# Patient Record
Sex: Male | Born: 1935
Health system: Southern US, Community
[De-identification: ages and names within clinical notes are randomized; demographics above are authoritative.]

## PROBLEM LIST (undated history)

## (undated) DIAGNOSIS — G4730 Sleep apnea, unspecified: Secondary | ICD-10-CM

## (undated) DIAGNOSIS — I10 Essential (primary) hypertension: Secondary | ICD-10-CM

## (undated) DIAGNOSIS — D72819 Decreased white blood cell count, unspecified: Principal | ICD-10-CM

## (undated) DIAGNOSIS — K219 Gastro-esophageal reflux disease without esophagitis: Secondary | ICD-10-CM

## (undated) HISTORY — DX: Sleep apnea, unspecified: G47.30

## (undated) HISTORY — DX: Decreased white blood cell count, unspecified: D72.819

## (undated) HISTORY — DX: Gastro-esophageal reflux disease without esophagitis: K21.9

## (undated) HISTORY — DX: Essential (primary) hypertension: I10

## (undated) HISTORY — PX: OTHER SURGICAL HISTORY: SHX169

---

## 2000-08-30 ENCOUNTER — Encounter: Payer: Self-pay | Admitting: Family Medicine

## 2000-08-30 ENCOUNTER — Ambulatory Visit (HOSPITAL_COMMUNITY): Admission: RE | Admit: 2000-08-30 | Discharge: 2000-08-30 | Payer: Self-pay | Admitting: Physician Assistant

## 2003-03-04 ENCOUNTER — Ambulatory Visit (HOSPITAL_COMMUNITY): Admission: RE | Admit: 2003-03-04 | Discharge: 2003-03-04 | Payer: Self-pay | Admitting: Gastroenterology

## 2003-03-04 ENCOUNTER — Encounter (INDEPENDENT_AMBULATORY_CARE_PROVIDER_SITE_OTHER): Payer: Self-pay | Admitting: Specialist

## 2004-12-31 ENCOUNTER — Ambulatory Visit: Payer: Self-pay | Admitting: Internal Medicine

## 2005-01-26 ENCOUNTER — Ambulatory Visit (HOSPITAL_COMMUNITY): Admission: RE | Admit: 2005-01-26 | Discharge: 2005-01-26 | Payer: Self-pay | Admitting: Family Medicine

## 2008-05-10 ENCOUNTER — Ambulatory Visit: Payer: Self-pay | Admitting: Internal Medicine

## 2008-05-10 DIAGNOSIS — G4733 Obstructive sleep apnea (adult) (pediatric): Secondary | ICD-10-CM

## 2008-05-20 DIAGNOSIS — I10 Essential (primary) hypertension: Secondary | ICD-10-CM | POA: Insufficient documentation

## 2008-05-20 DIAGNOSIS — K219 Gastro-esophageal reflux disease without esophagitis: Secondary | ICD-10-CM

## 2008-09-19 ENCOUNTER — Ambulatory Visit: Payer: Self-pay | Admitting: Oncology

## 2008-10-03 LAB — CMP (CANCER CENTER ONLY)
ALT(SGPT): 23 U/L (ref 10–47)
AST: 22 U/L (ref 11–38)
Albumin: 3.7 g/dL (ref 3.3–5.5)
Alkaline Phosphatase: 68 U/L (ref 26–84)
BUN, Bld: 14 mg/dL (ref 7–22)
CO2: 27 mEq/L (ref 18–33)
Calcium: 9.5 mg/dL (ref 8.0–10.3)
Chloride: 105 mEq/L (ref 98–108)
Creat: 1.2 mg/dl (ref 0.6–1.2)
Glucose, Bld: 91 mg/dL (ref 73–118)
Potassium: 3.8 mEq/L (ref 3.3–4.7)
Sodium: 137 mEq/L (ref 128–145)
Total Bilirubin: 0.7 mg/dl (ref 0.20–1.60)
Total Protein: 7 g/dL (ref 6.4–8.1)

## 2008-10-03 LAB — CBC WITH DIFFERENTIAL (CANCER CENTER ONLY)
BASO#: 0 10*3/uL (ref 0.0–0.2)
EOS%: 1 % (ref 0.0–7.0)
Eosinophils Absolute: 0 10*3/uL (ref 0.0–0.5)
HCT: 41.6 % (ref 38.7–49.9)
HGB: 14.4 g/dL (ref 13.0–17.1)
MCH: 28.7 pg (ref 28.0–33.4)
MCHC: 34.5 g/dL (ref 32.0–35.9)
MCV: 83 fL (ref 82–98)
MONO%: 7.8 % (ref 0.0–13.0)
NEUT%: 55.6 % (ref 40.0–80.0)
RBC: 5.01 10*6/uL (ref 4.20–5.70)

## 2008-10-03 LAB — MORPHOLOGY - CHCC SATELLITE: PLT EST ~~LOC~~: ADEQUATE

## 2008-10-07 LAB — FERRITIN: Ferritin: 23 ng/mL (ref 22–322)

## 2008-10-07 LAB — IRON AND TIBC
%SAT: 27 % (ref 20–55)
Iron: 98 ug/dL (ref 42–165)
TIBC: 365 ug/dL (ref 215–435)
UIBC: 267 ug/dL

## 2008-10-07 LAB — RETICULOCYTES (CHCC): Retic Ct Pct: 0.7 % (ref 0.4–3.1)

## 2008-10-07 LAB — PROTEIN ELECTROPHORESIS, SERUM
Albumin ELP: 60.8 % (ref 55.8–66.1)
Alpha-1-Globulin: 3.5 % (ref 2.9–4.9)
Beta 2: 4.5 % (ref 3.2–6.5)
Total Protein, Serum Electrophoresis: 6.9 g/dL (ref 6.0–8.3)

## 2008-10-07 LAB — ERYTHROPOIETIN: Erythropoietin: 18.9 m[IU]/mL (ref 2.6–34.0)

## 2008-12-05 ENCOUNTER — Ambulatory Visit: Payer: Self-pay | Admitting: Oncology

## 2008-12-10 LAB — CBC WITH DIFFERENTIAL (CANCER CENTER ONLY)
BASO#: 0 10*3/uL (ref 0.0–0.2)
Eosinophils Absolute: 0.1 10*3/uL (ref 0.0–0.5)
HCT: 44.1 % (ref 38.7–49.9)
HGB: 15.3 g/dL (ref 13.0–17.1)
LYMPH#: 1.4 10*3/uL (ref 0.9–3.3)
MCHC: 34.6 g/dL (ref 32.0–35.9)
MONO#: 0.3 10*3/uL (ref 0.1–0.9)
NEUT%: 53.6 % (ref 40.0–80.0)
RBC: 4.94 10*6/uL (ref 4.20–5.70)
WBC: 3.8 10*3/uL — ABNORMAL LOW (ref 4.0–10.0)

## 2009-05-09 ENCOUNTER — Ambulatory Visit: Payer: Self-pay | Admitting: Internal Medicine

## 2009-12-04 ENCOUNTER — Ambulatory Visit: Payer: Self-pay | Admitting: Oncology

## 2009-12-11 LAB — CBC WITH DIFFERENTIAL (CANCER CENTER ONLY)
BASO#: 0 10*3/uL (ref 0.0–0.2)
BASO%: 0.7 % (ref 0.0–2.0)
EOS%: 0.9 % (ref 0.0–7.0)
Eosinophils Absolute: 0 10*3/uL (ref 0.0–0.5)
HCT: 43.8 % (ref 38.7–49.9)
HGB: 15.2 g/dL (ref 13.0–17.1)
LYMPH#: 1.3 10*3/uL (ref 0.9–3.3)
LYMPH%: 36.9 % (ref 14.0–48.0)
MCH: 32.7 pg (ref 28.0–33.4)
MCHC: 34.7 g/dL (ref 32.0–35.9)
MCV: 94 fL (ref 82–98)
MONO#: 0.3 10*3/uL (ref 0.1–0.9)
MONO%: 7.3 % (ref 0.0–13.0)
NEUT#: 2 10*3/uL (ref 1.5–6.5)
NEUT%: 54.2 % (ref 40.0–80.0)
Platelets: 181 10*3/uL (ref 145–400)
RBC: 4.65 10*6/uL (ref 4.20–5.70)
RDW: 11.2 % (ref 10.5–14.6)
WBC: 3.6 10*3/uL — ABNORMAL LOW (ref 4.0–10.0)

## 2009-12-11 LAB — FERRITIN: Ferritin: 16 ng/mL — ABNORMAL LOW (ref 22–322)

## 2009-12-11 LAB — IRON AND TIBC
%SAT: 19 % — ABNORMAL LOW (ref 20–55)
Iron: 73 ug/dL (ref 42–165)
TIBC: 381 ug/dL (ref 215–435)
UIBC: 308 ug/dL

## 2009-12-21 ENCOUNTER — Encounter: Payer: Self-pay | Admitting: Internal Medicine

## 2010-05-08 ENCOUNTER — Ambulatory Visit: Admit: 2010-05-08 | Payer: Self-pay | Admitting: Internal Medicine

## 2010-06-04 NOTE — Letter (Signed)
Summary: CPAP Supplies/[facility]  CPAP Supplies/[facility]   Imported By: Sherian Rein 12/29/2009 07:57:01  _____________________________________________________________________  External Attachment:    Type:   Image     Comment:   External Document

## 2010-06-04 NOTE — Assessment & Plan Note (Signed)
Summary: f/u 1 yr///kp   Primary Provider/Referring Provider:  Purnell Shoemaker  CC:  yearly follow up visit-sleep; needs new supplies; no trouble with pressure.Marland Kitchen  History of Present Illness:  05/10/08- OSA  75 YOM Dx'd by me in 1992. Still on cpap  pressure 7 cwp. [facility]. Last here in 2006. Now needs new mask and expresses concern that chronic strap use is putting a dent in his skull.l. Feels pressure is ok.  Bedtime 10-12PM, sleep latency 15 minutes, waking severall times, up at 6:30 AM.  May 09, 2009- OSA Continues CPAP 7 He always feels a little sleepy without change x 50 years. He has to be careful at times about his driving. 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  We discussed sleep habit. 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   Current Medications (verified): 1)  Prilosec Otc 20 Mg Tbec (Omeprazole Magnesium) .... Take 1 By Mouth Once Daily 2)  Hydrochlorothiazide 25 Mg Tabs (Hydrochlorothiazide) .... Take 1/2 By Mouth Once Daily 3)  Cpap  7 .... [facility]  Allergies (verified): 1)  ! Sulfa  Past History:  Past Medical History: Last updated: 05/10/2008 Hypertension Sleep Apnea G E [facility]  Past Surgical History: Last updated: 05/10/2008 Dupyetrens's contracture. Salivary gland resected.  Family History: Last updated: 05/10/2008 No sleep apnea Diabetes Mother died CHF, arthritis Father alive , hx of heart disease  Social History: Last updated: 05/09/2009 Patient never smoked.  Married Designs brick-laying equipment  Risk Factors: Smoking Status: never (05/10/2008)  Social History: Patient never smoked.  Married Government social research officer  Review of Systems      See HPI  The patient denies anorexia, fever, weight loss, weight gain, vision loss, decreased hearing, hoarseness, chest pain, syncope, dyspnea on exertion, peripheral edema, prolonged cough, headaches, hemoptysis, abdominal  pain, and severe indigestion/heartburn.    Vital Signs:  Patient profile:   75 year old male Height:      66 inches Weight:      191.25 pounds BMI:     30.98 O2 Sat:      97 % on Room air Pulse rate:   69 / minute BP sitting:   126 / 86  (right arm) Cuff size:   regular  Vitals Entered By: Reynaldo Minium CMA (May 09, 2009 2:25 PM)  O2 Flow:  Room air  Physical Exam  Additional Exam:  General: A/Ox3; pleasant and cooperative, NAD, glasses SKIN: no rash, lesions NODES: no lymphadenopathy HEENT: /AT, EOM- WNL, Conjuctivae- clear, PERRLA, TM-WNL, Nose- clear, Throat- clear and wnl, Melampatti III, dentures NECK: Supple w/ fair ROM, JVD- none, normal carotid impulses w/o bruits Thyroid- normal to palpation CHEST: Clear to P&A HEART: RRR, no m/g/r heard ABDOMEN: Soft and nl;  GNF:AOZH, nl pulses, no edema  NEURO: Grossly intact to observation      Impression & Recommendations:  Problem # 1:  SLEEP APNEA (ICD-780.57)  We will try increasing his cpap to 8 and letting him try Nuvigil after discussion.  Orders: Est. Patient Level III (08657) DME Referral (DME)  Medications Added to Medication List This Visit: 1)  Cpap 8  .... [facility]  Patient Instructions: 1)  Schedule return in one year, earlier if needed 2)  We will ask [facility] to try increasing your CPAP to 8. If you don't like it let me know and we will turn it back. 3)  Try sample Nuvigil 150 mg, one in the morning  if needed for alertness.

## 2010-06-15 ENCOUNTER — Telehealth: Payer: Self-pay | Admitting: Internal Medicine

## 2010-06-22 ENCOUNTER — Ambulatory Visit (INDEPENDENT_AMBULATORY_CARE_PROVIDER_SITE_OTHER): Payer: Medicare Other | Admitting: Internal Medicine

## 2010-06-22 ENCOUNTER — Encounter: Payer: Self-pay | Admitting: Internal Medicine

## 2010-06-22 ENCOUNTER — Telehealth: Payer: Self-pay | Admitting: Internal Medicine

## 2010-06-22 DIAGNOSIS — G4730 Sleep apnea, unspecified: Secondary | ICD-10-CM

## 2010-06-24 NOTE — Progress Notes (Signed)
Summary: allergies  Phone Note Call from Patient   Caller: Patient Call For: Jas Betten Summary of Call: patient phoned stated that he needs to see Dr Maple Hudson he has a cold and his nose is sore it is a struggle for him to use his CPAP due to his nose. He is having problems with allergies. Dr Sinclair Ship first availabe is 3/15 and he needs to be seen sooner. Please advise pt at [phone] Initial call taken by: Vedia Coffer,  June 15, 2010 11:59 AM  Follow-up for Phone Call        The patient c/o runny nose, nasal congestion and sneezing. Sx's make it difficult to wear cpap at night and he would like to know what to do. He says he is not using any allergy medications or using any nasal sprays. Pt did sch f/u appt w/ [facility] on 06/22/2010. Pls advise.Michel Bickers [facility]  June 15, 2010 3:50 PM ALLERGIES: SULFA  Additional Follow-up for Phone Call Additional follow up Details #1::        per CY---would start with claritin/loratadine or with allergra 180/fexofenadine as needed ...lmomtbc to make pt aware of med recs per CY Randell Loop CMA  June 15, 2010 4:34 PM   spoke with pt and advised of recs.Carron Curie CMA  June 16, 2010 9:16 AM

## 2010-06-25 DIAGNOSIS — J310 Chronic rhinitis: Secondary | ICD-10-CM | POA: Insufficient documentation

## 2010-06-30 NOTE — Progress Notes (Signed)
Summary: requesting replacement cpap  Phone Note From Other Clinic   Caller: Buford Dresser, RT with [facility] Call For: Dr. Maple Hudson Summary of Call: [phone] Mardelle Matte with [facility] called and stated that pt stopped by [facility] to get humidifier for CPAP Device. This is an old CPAP unit and the company doesn't make a seperate humdifier. Pt's old CPAP is currently set on a pressure of 8 cwp, however, it is not holding this pressure. May pt have an order for a replacement CPAP device. Pt's current CPAP machine is older than 73 yrs old.  Please advise. Alfonso Ramus  June 22, 2010 1:57 PM  Initial call taken by: Alfonso Ramus,  June 22, 2010 1:58 PM  Follow-up for Phone Call        Will order new CPAP with humidifier.     New/Updated Medications: * CPAP MACHINE 8 CWP, HEATED HUMIDIFIER AND SUPPLIES.

## 2010-06-30 NOTE — Assessment & Plan Note (Signed)
Summary: 1 yr follow-up/LC   Primary Provider/Referring Provider:  Purnell Shoemaker  CC:  Yearly follow up visit-sleep apnea; nose sore from allergies so had to use Claritin.Seth Gray  History of Present Illness: 05/10/08- OSA  75 YOM Dx'd by me in 1992. Still on cpap  pressure 7 cwp. [facility]. Last here in 2006. Now needs new mask and expresses concern that chronic strap use is putting a dent in his skull.l. Feels pressure is ok.  Bedtime 10-12PM, sleep latency 15 minutes, waking severall times, up at 6:30 AM.  May 09, 2009- OSA Continues CPAP 7 He always feels a little sleepy without change x 50 years. He has to be careful at times about his driving. 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  We discussed sleep habit. 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  June 22, 2010- OSA, rhinitis Nurse-CC: Yearly follow up visit-sleep apnea; nose sore from allergies so had to use Claritin.  CPAP 8/ [facility]-Every morning his nose runs and he sneezes. Uses Nasal pillows mask. Likes that style. Tussive frontal headache. Questioned sinusitis- amoxacillin didn't help and caused rash. Denies fever, epistaxis, wheeze or cough. Does not have a humidifier.     Preventive Screening-Counseling & Management  Alcohol-Tobacco     Smoking Status: never  Current Medications (verified): 1)  Prilosec Otc 20 Mg Tbec (Omeprazole Magnesium) .... Take 1 By Mouth Two Times A Day 2)  Hydrochlorothiazide 25 Mg Tabs (Hydrochlorothiazide) .... Take 1/2 By Mouth Once Daily 3)  Cpap  8 .... [facility]  Allergies: 1)  ! Sulfa 2)  ! Amoxicillin  Past History:  Past Surgical History: Last updated: 05/10/2008 Dupyetrens's contracture. Salivary gland resected.  Family History: Last updated: 05/10/2008 No sleep apnea Diabetes Mother died CHF, arthritis Father alive , hx of heart disease  Social History: Last updated: 05/09/2009 Patient never smoked.    Married Designs brick-laying equipment  Risk Factors: Smoking Status: never (06/22/2010)  Past Medical History: Hypertension Sleep Apnea G E R D Rhinitis  Review of Systems      See HPI       The patient complains of headaches.  The patient denies anorexia, fever, weight loss, weight gain, vision loss, decreased hearing, hoarseness, chest pain, syncope, dyspnea on exertion, peripheral edema, prolonged cough, hemoptysis, abdominal pain, and severe indigestion/heartburn.    Vital Signs:  Patient profile:   75 year old male Height:      66 inches Weight:      187.13 pounds BMI:     30.31 O2 Sat:      96 % on Room air Pulse rate:   61 / minute BP sitting:   122 / 74  (left arm) Cuff size:   regular  Vitals Entered By: Reynaldo Minium CMA (June 22, 2010 12:13 PM)  O2 Flow:  Room air CC: Yearly follow up visit-sleep apnea; nose sore from allergies so had to use Claritin.   Physical Exam  Additional Exam:  General: A/Ox3; pleasant and cooperative, NAD, glasses SKIN: no rash, lesions NODES: no lymphadenopathy HEENT: [place]/AT, EOM- WNL, Conjuctivae- clear, PERRLA, TM-WNL, Nose- clear, Throat- clear and wnl, Mallampati  III, dentures NECK: Supple w/ fair ROM, JVD- none, normal carotid impulses w/o bruits Thyroid- normal to palpation CHEST: Clear to P&A HEART: RRR, no m/g/r heard ABDOMEN: Soft and nl;  ZOX:WRUE, nl pulses, no edema  NEURO: Grossly intact to observation      Impression & Recommendations:  Problem # 1:  SLEEP APNEA (ICD-780.57)  Still good compliance and control with CPAP. He sleeps in unheated room, without CPAP humidifier. We will continue pressure at 8, but see about addinjg humidifier. He might also like nasal saline gel..   Problem # 2:  CHRONIC RHINITIS (ICD-472.0) Describes persistent variable nasal congestion. sometimes CPAP pressure is uncomfortable. Not much sneeze, drainage. There may be some infection, but adequate humidification may be  enough intervention now. .   Medications Added to Medication List This Visit: 1)  Prilosec Otc 20 Mg Tbec (Omeprazole magnesium) .... Take 1 by mouth two times a day 2)  Cpap Humidifier  3)  Cpap Machine 8 Cwp, Heated Humidifier and Supplies.  .... [facility]  Other Orders: Est. Patient Level III (04540) DME Referral (DME)  Patient Instructions: 1)  Please schedule a follow-up appointment in 1 year. 2)  Try nasal saline gel and/or saline nose spray- otc 3)  We will contact [facility] about getting you a humidifier for your machine. Prescriptions: CPAP MACHINE 8 CWP, HEATED HUMIDIFIER AND SUPPLIES.   #1 x prn   Entered and Authorized by:   Waymon Budge MD   Signed by:   Waymon Budge MD on 06/23/2010   Method used:   Print then Give to Patient   RxID:   [id number] CPAP  8 [facility]  #1 x 0   Entered and Authorized by:   Waymon Budge MD   Signed by:   Waymon Budge MD on 06/23/2010   Method used:   Print then Give to Patient   RxID:   [id number]

## 2010-09-18 NOTE — Op Note (Signed)
   NAME:  Seth Gray, Seth Gray                            ACCOUNT NO.:  [id number]   MEDICAL RECORD NO.:  [id number]                   PATIENT TYPE:  AMB   LOCATION:  ENDO                                 FACILITY:  [facility]   PHYSICIAN:  John C. Madilyn Fireman, M.D.                 DATE OF BIRTH:  07/06/1935   DATE OF PROCEDURE:  03/04/2003  DATE OF DISCHARGE:                                 OPERATIVE REPORT   PROCEDURE:  Colonoscopy.   INDICATION FOR PROCEDURE:  Colon cancer screening in a 75 year old patient  with no prior screening.   DESCRIPTION OF PROCEDURE:  The patient was placed in the left lateral  decubitus position and placed on the pulse monitor with continuous low-flow  oxygen delivered by nasal cannula.  He was sedated with 50 mcg IV fentanyl  and 5 mg IV Versed.  The Olympus video colonoscope was inserted into the  rectum and advanced to the cecum, confirmed by transillumination at  McBurney's point and visualization of the ileocecal valve and appendiceal  orifice.  The prep was good.  The cecum and ascending colon appeared normal  with no masses, polyps, diverticula, or other mucosal abnormalities.  In the  transverse colon was an 8 mm sessile polyp, which was fulgurated by hot  biopsy.  The remainder of the transverse colon appeared normal.  The  descending and sigmoid also appeared normal.  The scope was then withdrawn.  The rectum appeared normal with no further polyps and retroflexed view of  the anus revealed no obvious hemorrhoids.  The scope was then withdrawn and  the patient returned to the recovery room in stable condition.  He tolerated  the procedure well, and there were no immediate complications.   IMPRESSION:  Transverse colon polyp, otherwise normal study.   PLAN:  Await histology to determine method and interval for future colon  screening.                                               John C. Madilyn Fireman, M.D.    JCH/MEDQ  D:  03/04/2003  T:  03/04/2003  Job:   [id number]   cc:   Lianne Bushy, M.D.  [address]  [place]  [place] [address]  Fax: [phone]

## 2011-01-29 ENCOUNTER — Other Ambulatory Visit: Payer: Self-pay | Admitting: Oncology

## 2011-01-29 ENCOUNTER — Encounter (HOSPITAL_BASED_OUTPATIENT_CLINIC_OR_DEPARTMENT_OTHER): Payer: Medicare Other | Admitting: Oncology

## 2011-01-29 DIAGNOSIS — D72819 Decreased white blood cell count, unspecified: Secondary | ICD-10-CM

## 2011-01-29 LAB — CBC WITH DIFFERENTIAL/PLATELET
Basophils Absolute: 0 10*3/uL (ref 0.0–0.1)
EOS%: 0.9 % (ref 0.0–7.0)
Eosinophils Absolute: 0 10*3/uL (ref 0.0–0.5)
LYMPH%: 34.4 % (ref 14.0–49.0)
MCH: 33.9 pg — ABNORMAL HIGH (ref 27.2–33.4)
MCV: 96.3 fL (ref 79.3–98.0)
MONO%: 6.5 % (ref 0.0–14.0)
Platelets: 169 10*3/uL (ref 140–400)
RBC: 4.63 10*6/uL (ref 4.20–5.82)
RDW: 13.1 % (ref 11.0–14.6)

## 2011-01-29 LAB — IRON AND TIBC
Iron: 92 ug/dL (ref 42–165)
UIBC: 251 ug/dL (ref 125–400)

## 2011-01-29 LAB — FERRITIN: Ferritin: 30 ng/mL (ref 22–322)

## 2011-05-21 DIAGNOSIS — N400 Enlarged prostate without lower urinary tract symptoms: Secondary | ICD-10-CM | POA: Diagnosis not present

## 2011-05-31 DIAGNOSIS — K219 Gastro-esophageal reflux disease without esophagitis: Secondary | ICD-10-CM | POA: Diagnosis not present

## 2011-05-31 DIAGNOSIS — Z79899 Other long term (current) drug therapy: Secondary | ICD-10-CM | POA: Diagnosis not present

## 2011-05-31 DIAGNOSIS — I10 Essential (primary) hypertension: Secondary | ICD-10-CM | POA: Diagnosis not present

## 2011-05-31 DIAGNOSIS — G4733 Obstructive sleep apnea (adult) (pediatric): Secondary | ICD-10-CM | POA: Diagnosis not present

## 2011-05-31 DIAGNOSIS — E781 Pure hyperglyceridemia: Secondary | ICD-10-CM | POA: Diagnosis not present

## 2011-06-22 ENCOUNTER — Encounter: Payer: Self-pay | Admitting: *Deleted

## 2011-06-23 ENCOUNTER — Ambulatory Visit (INDEPENDENT_AMBULATORY_CARE_PROVIDER_SITE_OTHER): Payer: Medicare Other | Admitting: Internal Medicine

## 2011-06-23 ENCOUNTER — Encounter: Payer: Self-pay | Admitting: Internal Medicine

## 2011-06-23 VITALS — BP 124/76 | HR 64 | Ht 66.0 in | Wt 189.8 lb

## 2011-06-23 DIAGNOSIS — G4733 Obstructive sleep apnea (adult) (pediatric): Secondary | ICD-10-CM

## 2011-06-23 DIAGNOSIS — G4730 Sleep apnea, unspecified: Secondary | ICD-10-CM

## 2011-06-23 DIAGNOSIS — J310 Chronic rhinitis: Secondary | ICD-10-CM | POA: Diagnosis not present

## 2011-06-23 NOTE — Patient Instructions (Signed)
We will clarify with [facility] what documentation is needed to qualify for replacement CPAP machine, mask and supplies   Script for replacement CPAP machine, 8 cwp, heated humidifier, mask of choice and supplies      For Dx OSA

## 2011-06-23 NOTE — Progress Notes (Signed)
06/23/11- 75 yoM never smoker followed for OSA, allergic rhinitis   LOV- 06/22/10 The heat of trying to get a humidifier for his CPAP machine which is 28 or 74 years old. [facility] is working with him. Pressure it 8 CWP. He is comfortable and using it all night every night. Perennial nasal congestion is under good control now. He uses occasional Benadryl and we compared that to nonsedating meds. We discussed the pending spring pollen season.  ROS-see HPI Constitutional:   No-   weight loss, night sweats, fevers, chills, fatigue, lassitude. HEENT:   No-  headaches, difficulty swallowing, tooth/dental problems, sore throat,       Little  sneezing, itching, ear ache, nasal congestion, post nasal drip,  CV:  No-   chest pain, orthopnea, PND, swelling in lower extremities, anasarca, dizziness, palpitations Resp: No-   shortness of breath with exertion or at rest.              No-   productive cough,  No non-productive cough,  No- coughing up of blood.              No-   change in color of mucus.  No- wheezing.   Skin: No-   rash or lesions. GI:  No-   heartburn, indigestion, abdominal pain, nausea, vomiting, diarrhea,                 change in bowel habits, loss of appetite GU: MS:  No-   joint pain or swelling.  No- decreased range of motion.  No- back pain. Neuro-     nothing unusual Psych:  No- change in mood or affect. No depression or anxiety.  No memory loss.  OBJ- Physical Exam General- Alert, Oriented, Affect-appropriate, Distress- none acute, overweight Skin- rash-none, lesions- none, excoriation- none Lymphadenopathy- none Head- atraumatic            Eyes- Gross vision intact, PERRLA, conjunctivae and secretions clear            Ears- Hearing, canals-normal            Nose- Clear, no-Septal dev, mucus, polyps, erosion, perforation             Throat- Mallampati III mucosa clear , drainage- none, tonsils- atrophic Neck- flexible , trachea midline, no stridor , thyroid nl, carotid no  bruit Chest - symmetrical excursion , unlabored           Heart/CV- RRR , no murmur , no gallop  , no rub, nl s1 s2                           - JVD- none , edema- none, stasis changes- none, varices- none           Lung- clear to P&A, wheeze- none, cough- none , dullness-none, rub- none           Chest wall-  Abd- Br/ Gen/ Rectal- Not done, not indicated Extrem- cyanosis- none, clubbing, none, atrophy- none, strength- nl Neuro- grossly intact to observation

## 2011-06-24 ENCOUNTER — Telehealth: Payer: Self-pay | Admitting: Internal Medicine

## 2011-06-24 DIAGNOSIS — G4733 Obstructive sleep apnea (adult) (pediatric): Secondary | ICD-10-CM

## 2011-06-24 NOTE — Telephone Encounter (Signed)
Per CY-okay to place order for home sleep study. Pt's wife aware we have placed order.

## 2011-06-24 NOTE — Telephone Encounter (Signed)
I spoke with spouse and she states since pt last sleep study was done in the 90's he will need to have another sleep study before medicare will pay for a new cpap. She stated pt is requesting to have a home sleep study done. Please advise Dr. Maple Hudson, thanks

## 2011-06-26 NOTE — Assessment & Plan Note (Signed)
We will clarify with [facility] further he is eligible for a new machine and that he would need a new study. Meanwhile we are seeking his previous study from the paper records.

## 2011-06-26 NOTE — Assessment & Plan Note (Signed)
Plan-suggested nonsedating OTC antihistamines. Try a sample Dymista

## 2011-06-30 ENCOUNTER — Ambulatory Visit (INDEPENDENT_AMBULATORY_CARE_PROVIDER_SITE_OTHER): Payer: Medicare Other | Admitting: Internal Medicine

## 2011-06-30 DIAGNOSIS — G4733 Obstructive sleep apnea (adult) (pediatric): Secondary | ICD-10-CM | POA: Diagnosis not present

## 2011-07-08 DIAGNOSIS — L84 Corns and callosities: Secondary | ICD-10-CM | POA: Diagnosis not present

## 2011-07-16 DIAGNOSIS — Z23 Encounter for immunization: Secondary | ICD-10-CM | POA: Diagnosis not present

## 2011-11-18 DIAGNOSIS — E781 Pure hyperglyceridemia: Secondary | ICD-10-CM | POA: Diagnosis not present

## 2011-11-18 DIAGNOSIS — T6391XA Toxic effect of contact with unspecified venomous animal, accidental (unintentional), initial encounter: Secondary | ICD-10-CM | POA: Diagnosis not present

## 2011-11-18 DIAGNOSIS — R609 Edema, unspecified: Secondary | ICD-10-CM | POA: Diagnosis not present

## 2011-11-18 DIAGNOSIS — I10 Essential (primary) hypertension: Secondary | ICD-10-CM | POA: Diagnosis not present

## 2011-11-18 DIAGNOSIS — M6740 Ganglion, unspecified site: Secondary | ICD-10-CM | POA: Diagnosis not present

## 2012-01-01 ENCOUNTER — Telehealth: Payer: Self-pay | Admitting: Oncology

## 2012-01-01 NOTE — Telephone Encounter (Signed)
S/w pt wife re appt for 11/18.

## 2012-02-28 DIAGNOSIS — Z23 Encounter for immunization: Secondary | ICD-10-CM | POA: Diagnosis not present

## 2012-03-17 ENCOUNTER — Other Ambulatory Visit: Payer: Self-pay | Admitting: Emergency Medicine

## 2012-03-17 DIAGNOSIS — D649 Anemia, unspecified: Secondary | ICD-10-CM

## 2012-03-20 ENCOUNTER — Other Ambulatory Visit (HOSPITAL_BASED_OUTPATIENT_CLINIC_OR_DEPARTMENT_OTHER): Payer: Medicare Other | Admitting: Lab

## 2012-03-20 ENCOUNTER — Encounter: Payer: Self-pay | Admitting: Oncology

## 2012-03-20 ENCOUNTER — Telehealth: Payer: Self-pay | Admitting: Oncology

## 2012-03-20 ENCOUNTER — Ambulatory Visit (HOSPITAL_BASED_OUTPATIENT_CLINIC_OR_DEPARTMENT_OTHER): Payer: Medicare Other | Admitting: Oncology

## 2012-03-20 VITALS — BP 137/81 | HR 64 | Temp 97.6°F | Resp 20 | Ht 66.0 in | Wt 185.0 lb

## 2012-03-20 DIAGNOSIS — D72819 Decreased white blood cell count, unspecified: Secondary | ICD-10-CM | POA: Diagnosis not present

## 2012-03-20 DIAGNOSIS — D509 Iron deficiency anemia, unspecified: Secondary | ICD-10-CM | POA: Diagnosis not present

## 2012-03-20 DIAGNOSIS — D649 Anemia, unspecified: Secondary | ICD-10-CM

## 2012-03-20 HISTORY — DX: Decreased white blood cell count, unspecified: D72.819

## 2012-03-20 LAB — CBC WITH DIFFERENTIAL/PLATELET
BASO%: 0.7 % (ref 0.0–2.0)
LYMPH%: 32.3 % (ref 14.0–49.0)
MCHC: 34.4 g/dL (ref 32.0–36.0)
MCV: 93.1 fL (ref 79.3–98.0)
MONO%: 7.9 % (ref 0.0–14.0)
Platelets: 177 10*3/uL (ref 140–400)
RBC: 4.51 10*6/uL (ref 4.20–5.82)

## 2012-03-20 LAB — IRON AND TIBC: %SAT: 24 % (ref 20–55)

## 2012-03-20 LAB — FERRITIN: Ferritin: 14 ng/mL — ABNORMAL LOW (ref 22–322)

## 2012-03-20 NOTE — Patient Instructions (Addendum)
Doing well  Lab Results  Component Value Date   WBC 4.6 03/20/2012   HGB 14.4 03/20/2012   HCT 41.9 03/20/2012   MCV 93.1 03/20/2012   PLT 177 03/20/2012

## 2012-03-20 NOTE — Telephone Encounter (Signed)
gve the pt his nov 2014 appt calendar °

## 2012-03-20 NOTE — Progress Notes (Signed)
  OFFICE PROGRESS NOTE  CC  No primary provider on file. No primary provider on file.  DIAGNOSIS: 76 year old gentleman with mild leukopenia and hypoferritinemia  PRIOR THERAPY:  #1 leukopenia: Observation  #2hypoferritinemia: Oral iron as needed  CURRENT THERAPY: Observation  INTERVAL HISTORY: Seth Gray 76 y.o. male returns for followup visit today. Overall he is doing well no real complaints. He has not had any infections no hospitalizations. He denies any nausea vomiting abdominal pain. No myalgias and arthralgias no rashes. Remainder of the 10 point review of systems is negative.  MEDICAL HISTORY: Past Medical History  Diagnosis Date  . Hypertension   . Sleep apnea   . ALLERGIC RHINITIS   . GERD (gastroesophageal reflux disease)   . Leukopenia 03/20/2012    ALLERGIES:  is allergic to amoxicillin and sulfonamide derivatives.  MEDICATIONS:  Current Outpatient Prescriptions  Medication Sig Dispense Refill  . aspirin 81 MG tablet Take 81 mg by mouth daily.      . hydrochlorothiazide (HYDRODIURIL) 25 MG tablet Take 1/2 tablet by mouth daily      . omeprazole (PRILOSEC) 20 MG capsule Take 20 mg by mouth 2 (two) times daily.        SURGICAL HISTORY:  Past Surgical History  Procedure Date  . Dupyetren's contracture   . Salivary gland extraction     REVIEW OF SYSTEMS:  Pertinent items are noted in HPI.   HEALTH MAINTENANCE:  PHYSICAL EXAMINATION: Blood pressure 137/81, pulse 64, temperature 97.6 F (36.4 C), temperature source Oral, resp. rate 20, height 5\' 6"  (1.676 m), weight 185 lb (83.915 kg). Body mass index is 29.86 kg/(m^2). ECOG PERFORMANCE STATUS: 0 - Asymptomatic Well-developed well-nourished male in no acute distress HEENT exam EOMI PERRLA sclerae anicteric no conjunctival pallor oral mucosa is moist no thrush no mucositis neck is supple lungs are clear cardiovascular regular rate rhythm abdomen is soft extremities no edema neuro is nonfocal     LABORATORY DATA: Lab Results  Component Value Date   WBC 4.6 03/20/2012   HGB 14.4 03/20/2012   HCT 41.9 03/20/2012   MCV 93.1 03/20/2012   PLT 177 03/20/2012      Chemistry      Component Value Date/Time   NA 137 10/03/2008 1251   K 3.8 10/03/2008 1251   CL 105 10/03/2008 1251   CO2 27 10/03/2008 1251   BUN 14 10/03/2008 1251   CREATININE 1.2 10/03/2008 1251      Component Value Date/Time   CALCIUM 9.5 10/03/2008 1251   ALKPHOS 68 10/03/2008 1251   AST 22 10/03/2008 1251   BILITOT 0.70 10/03/2008 1251       RADIOGRAPHIC STUDIES:  No results found.  ASSESSMENT: 76 year old gentleman with leukopenia and low ferritin. He is on observation for leukopenia. For his low ferritin he does take oral iron as needed. His CBC looks terrific today we will check up on his iron studies.   PLAN: He will continue to followup with me on a yearly basis   All questions were answered. The patient knows to call the clinic with any problems, questions or concerns. We can certainly see the patient much sooner if necessary.  I spent 15 minutes counseling the patient face to face. The total time spent in the appointment was 30 minutes.    Drue Second, MD Medical/Oncology [facility] [phone] (beeper) [phone] (Office)  03/20/2012, 1:10 PM

## 2012-03-21 ENCOUNTER — Telehealth: Payer: Self-pay | Admitting: *Deleted

## 2012-03-21 NOTE — Telephone Encounter (Signed)
Message copied by Cooper Render on Tue Mar 21, 2012  1:44 PM ------      Message from: Laural Golden      Created: Tue Mar 21, 2012  9:22 AM       Please tell patient to continue oral iron.              ----- Message -----         From: [facility] Interface         Sent: 03/20/2012  12:06 PM           To: Victorino December, MD

## 2012-03-21 NOTE — Telephone Encounter (Signed)
Left VMOVM for patient to continue taking oral iron as instructed by MD.

## 2012-04-18 DIAGNOSIS — L578 Other skin changes due to chronic exposure to nonionizing radiation: Secondary | ICD-10-CM | POA: Diagnosis not present

## 2012-04-18 DIAGNOSIS — L821 Other seborrheic keratosis: Secondary | ICD-10-CM | POA: Diagnosis not present

## 2012-04-18 DIAGNOSIS — L570 Actinic keratosis: Secondary | ICD-10-CM | POA: Diagnosis not present

## 2012-04-18 DIAGNOSIS — D1739 Benign lipomatous neoplasm of skin and subcutaneous tissue of other sites: Secondary | ICD-10-CM | POA: Diagnosis not present

## 2012-05-11 DIAGNOSIS — H524 Presbyopia: Secondary | ICD-10-CM | POA: Diagnosis not present

## 2012-05-11 DIAGNOSIS — H5200 Hypermetropia, unspecified eye: Secondary | ICD-10-CM | POA: Diagnosis not present

## 2012-05-11 DIAGNOSIS — H2510 Age-related nuclear cataract, unspecified eye: Secondary | ICD-10-CM | POA: Diagnosis not present

## 2012-05-11 DIAGNOSIS — H52229 Regular astigmatism, unspecified eye: Secondary | ICD-10-CM | POA: Diagnosis not present

## 2012-05-24 DIAGNOSIS — I10 Essential (primary) hypertension: Secondary | ICD-10-CM | POA: Diagnosis not present

## 2012-05-24 DIAGNOSIS — E781 Pure hyperglyceridemia: Secondary | ICD-10-CM | POA: Diagnosis not present

## 2012-05-24 DIAGNOSIS — Z0000 Encounter for general adult medical examination without abnormal findings: Secondary | ICD-10-CM | POA: Diagnosis not present

## 2012-05-24 DIAGNOSIS — Z125 Encounter for screening for malignant neoplasm of prostate: Secondary | ICD-10-CM | POA: Diagnosis not present

## 2012-06-22 ENCOUNTER — Ambulatory Visit: Payer: Medicare Other | Admitting: Internal Medicine

## 2012-07-18 DIAGNOSIS — H40019 Open angle with borderline findings, low risk, unspecified eye: Secondary | ICD-10-CM | POA: Diagnosis not present

## 2012-07-18 DIAGNOSIS — H2510 Age-related nuclear cataract, unspecified eye: Secondary | ICD-10-CM | POA: Diagnosis not present

## 2012-07-25 ENCOUNTER — Ambulatory Visit (INDEPENDENT_AMBULATORY_CARE_PROVIDER_SITE_OTHER): Payer: Medicare Other | Admitting: Internal Medicine

## 2012-07-25 ENCOUNTER — Encounter: Payer: Self-pay | Admitting: Internal Medicine

## 2012-07-25 VITALS — BP 126/78 | HR 58 | Ht 66.0 in | Wt 189.2 lb

## 2012-07-25 DIAGNOSIS — G4733 Obstructive sleep apnea (adult) (pediatric): Secondary | ICD-10-CM | POA: Diagnosis not present

## 2012-07-25 NOTE — Progress Notes (Signed)
06/23/11- 75 yoM never smoker followed for OSA, allergic rhinitis   LOV- 06/22/10 The heat of trying to get a humidifier for his CPAP machine which is 14 or 75 years old. [facility] is working with him. Pressure it 8 CWP. He is comfortable and using it all night every night. Perennial nasal congestion is under good control now. He uses occasional Benadryl and we compared that to nonsedating meds. We discussed the pending spring pollen season.  07/25/12- 76 yoM never smoker followed for OSA, allergic rhinitis FOLLOWS FOR: wears CPAP every night for about 6-8 hours and pressure working well Has new CPAP machine 8/[facility], but still using his old machine because he says it works fine. He thinks he is sleeping well. He considers his mild seasonal allergic rhinitis to be only a nuisance. We discussed antihistamines if needed.  ROS-see HPI Constitutional:   No-   weight loss, night sweats, fevers, chills, fatigue, lassitude. HEENT:   No-  headaches, difficulty swallowing, tooth/dental problems, sore throat,       +Little  sneezing, itching, ear ache, nasal congestion, post nasal drip,  CV:  No-   chest pain, orthopnea, PND, swelling in lower extremities, anasarca, dizziness, palpitations Resp: No-   shortness of breath with exertion or at rest.              No-   productive cough,  No non-productive cough,  No- coughing up of blood.              No-   change in color of mucus.  No- wheezing.   Skin: No-   rash or lesions. GI:  No-   heartburn, indigestion, abdominal pain, nausea, vomiting,  GU: MS:  No-   joint pain or swelling.  . Neuro-     nothing unusual Psych:  No- change in mood or affect. No depression or anxiety.  No memory loss.  OBJ- Physical Exam General- Alert, Oriented, Affect-appropriate, Distress- none acute, overweight Skin- rash-none, lesions- none, excoriation- none Lymphadenopathy- none Head- atraumatic            Eyes- Gross vision intact, PERRLA, conjunctivae and secretions  clear            Ears- Hearing aid            Nose- Clear, no-Septal dev, mucus, polyps, erosion, perforation             Throat- Mallampati III mucosa clear , drainage- none, tonsils- atrophic. + Upper plate, lower partial. Neck- flexible , trachea midline, no stridor , thyroid nl, carotid no bruit Chest - symmetrical excursion , unlabored           Heart/CV- RRR , no murmur , no gallop  , no rub, nl s1 s2                           - JVD- none , edema- none, stasis changes- none, varices- none           Lung- clear to P&A, wheeze- none, cough- none , dullness-none, rub- none           Chest wall-  Abd- Br/ Gen/ Rectal- Not done, not indicated Extrem- cyanosis- none, clubbing, none, atrophy- none, strength- nl Neuro- grossly intact to observation

## 2012-07-25 NOTE — Patient Instructions (Addendum)
We can continue CPAP 8/ [facility]  Order- DME [facility]- replacement CPAP mask of choice and supplies  Dx OSA  Consider looking, maybe at [facility] or [employer], for a rechargeable battery pack that would run your CPAP machine overnight if power goes off. [facility] might also have some suggestion for this.   Please call as needed

## 2012-07-27 DIAGNOSIS — M6740 Ganglion, unspecified site: Secondary | ICD-10-CM | POA: Diagnosis not present

## 2012-07-27 DIAGNOSIS — Z1331 Encounter for screening for depression: Secondary | ICD-10-CM | POA: Diagnosis not present

## 2012-07-30 NOTE — Assessment & Plan Note (Signed)
Good CPAP compliance and control. The pressure is appropriate. Plan-replacement mask and supplies. Discussed  sleep hygiene.

## 2012-09-18 DIAGNOSIS — H2510 Age-related nuclear cataract, unspecified eye: Secondary | ICD-10-CM | POA: Diagnosis not present

## 2012-09-18 DIAGNOSIS — H269 Unspecified cataract: Secondary | ICD-10-CM | POA: Diagnosis not present

## 2012-09-18 DIAGNOSIS — H52209 Unspecified astigmatism, unspecified eye: Secondary | ICD-10-CM | POA: Diagnosis not present

## 2012-09-19 DIAGNOSIS — H2510 Age-related nuclear cataract, unspecified eye: Secondary | ICD-10-CM | POA: Diagnosis not present

## 2012-10-02 DIAGNOSIS — H2510 Age-related nuclear cataract, unspecified eye: Secondary | ICD-10-CM | POA: Diagnosis not present

## 2012-10-02 DIAGNOSIS — H269 Unspecified cataract: Secondary | ICD-10-CM | POA: Diagnosis not present

## 2012-10-02 DIAGNOSIS — H52209 Unspecified astigmatism, unspecified eye: Secondary | ICD-10-CM | POA: Diagnosis not present

## 2012-10-04 DIAGNOSIS — S8010XA Contusion of unspecified lower leg, initial encounter: Secondary | ICD-10-CM | POA: Diagnosis not present

## 2012-10-09 DIAGNOSIS — H6120 Impacted cerumen, unspecified ear: Secondary | ICD-10-CM | POA: Diagnosis not present

## 2012-10-09 DIAGNOSIS — R229 Localized swelling, mass and lump, unspecified: Secondary | ICD-10-CM | POA: Diagnosis not present

## 2012-10-09 DIAGNOSIS — T148 Other injury of unspecified body region: Secondary | ICD-10-CM | POA: Diagnosis not present

## 2012-10-09 DIAGNOSIS — W57XXXA Bitten or stung by nonvenomous insect and other nonvenomous arthropods, initial encounter: Secondary | ICD-10-CM | POA: Diagnosis not present

## 2012-11-01 DIAGNOSIS — J019 Acute sinusitis, unspecified: Secondary | ICD-10-CM | POA: Diagnosis not present

## 2012-11-23 ENCOUNTER — Telehealth: Payer: Self-pay | Admitting: Oncology

## 2012-11-23 DIAGNOSIS — J309 Allergic rhinitis, unspecified: Secondary | ICD-10-CM | POA: Diagnosis not present

## 2012-11-23 DIAGNOSIS — D509 Iron deficiency anemia, unspecified: Secondary | ICD-10-CM | POA: Diagnosis not present

## 2012-11-23 DIAGNOSIS — I10 Essential (primary) hypertension: Secondary | ICD-10-CM | POA: Diagnosis not present

## 2012-11-23 NOTE — Telephone Encounter (Signed)
Fax medical records to [facility].

## 2012-12-12 DIAGNOSIS — L821 Other seborrheic keratosis: Secondary | ICD-10-CM | POA: Diagnosis not present

## 2012-12-12 DIAGNOSIS — D1739 Benign lipomatous neoplasm of skin and subcutaneous tissue of other sites: Secondary | ICD-10-CM | POA: Diagnosis not present

## 2012-12-12 DIAGNOSIS — L820 Inflamed seborrheic keratosis: Secondary | ICD-10-CM | POA: Diagnosis not present

## 2012-12-29 ENCOUNTER — Telehealth: Payer: Self-pay | Admitting: *Deleted

## 2012-12-29 NOTE — Telephone Encounter (Signed)
sw pt informed the pt on 03/21/13 kk will be in [facility]. gv appts for 03/26/13 w/ labs@ 9:30am and ov@ 10am. Pt is aware.Seth Kitchentd

## 2013-01-10 DIAGNOSIS — K219 Gastro-esophageal reflux disease without esophagitis: Secondary | ICD-10-CM | POA: Diagnosis not present

## 2013-01-10 DIAGNOSIS — R0789 Other chest pain: Secondary | ICD-10-CM | POA: Diagnosis not present

## 2013-01-15 ENCOUNTER — Ambulatory Visit (INDEPENDENT_AMBULATORY_CARE_PROVIDER_SITE_OTHER): Payer: Medicare Other | Admitting: Cardiovascular Disease

## 2013-01-15 ENCOUNTER — Encounter: Payer: Self-pay | Admitting: Cardiovascular Disease

## 2013-01-15 VITALS — BP 124/74 | HR 59 | Resp 16 | Ht 66.5 in | Wt 190.7 lb

## 2013-01-15 DIAGNOSIS — I10 Essential (primary) hypertension: Secondary | ICD-10-CM | POA: Diagnosis not present

## 2013-01-15 DIAGNOSIS — G4733 Obstructive sleep apnea (adult) (pediatric): Secondary | ICD-10-CM | POA: Diagnosis not present

## 2013-01-15 DIAGNOSIS — R079 Chest pain, unspecified: Secondary | ICD-10-CM

## 2013-01-15 DIAGNOSIS — K219 Gastro-esophageal reflux disease without esophagitis: Secondary | ICD-10-CM | POA: Diagnosis not present

## 2013-01-15 MED ORDER — OMEPRAZOLE 20 MG PO CPDR: 20.0000 mg | DELAYED_RELEASE_CAPSULE | Freq: Two times a day (BID) | ORAL | Status: DC

## 2013-01-15 NOTE — Assessment & Plan Note (Signed)
His symptoms have none of the typical hallmarks of coronary insufficiency. The symptoms occur at rest and are not in any way worsened by fairly intense physical activity. His symptoms are relieved by antacids and belching. Omeprazole diminishes the frequency of events. He has had similar complaints for years. A previous stress test was "false positive" with normal coronary angiography findings.

## 2013-01-15 NOTE — Assessment & Plan Note (Signed)
CPAP compliant.  

## 2013-01-15 NOTE — Progress Notes (Signed)
Patient ID: Seth Gray, male   DOB: 1936/01/18, 77 y.o.   MRN: [id number]     Reason for office visit Chest pain evaluation  Mr. Novacek is a fit and active older man with a recent episode of protracted chest discomfort. He has HTN and several paternal uncles with early CAD, but no other coronary risk factors. Although the symptoms began during light activity, they do not appear clearly related to exertion.Similar episodes have occurred for more than 10-15 years and are sporadic and associate GI symptoms. Coronary angio in 2001 was normal, after a false positive ECG stress test.Now asymptomatic. Is troubled frequently by GERD.    Allergies  Allergen Reactions  . Amoxicillin     REACTION: rash  . Sulfonamide Derivatives     Current Outpatient Prescriptions  Medication Sig Dispense Refill  . aspirin 81 MG tablet Take 81 mg by mouth daily.      . diphenhydrAMINE (BENADRYL) 25 MG tablet Take 25 mg by mouth as needed for itching.      . ferrous fumarate (HEMOCYTE - 106 MG FE) 325 (106 FE) MG TABS Take 1 tablet by mouth daily.      . hydrochlorothiazide (HYDRODIURIL) 25 MG tablet Take 1/2 tablet by mouth daily      . NITROSTAT 0.4 MG SL tablet Place 0.4 mg under the tongue every 5 (five) minutes as needed.       Marland Kitchen omeprazole (PRILOSEC) 20 MG capsule Take 1 capsule (20 mg total) by mouth 2 (two) times daily.  60 capsule  6  . fluticasone (FLONASE) 50 MCG/ACT nasal spray as needed.      . loratadine (CLARITIN) 10 MG tablet Take 10 mg by mouth daily.       No current facility-administered medications for this visit.    Past Medical History  Diagnosis Date  . Hypertension   . Sleep apnea   . ALLERGIC RHINITIS   . GERD (gastroesophageal reflux disease)   . Leukopenia 03/20/2012    Past Surgical History  Procedure Laterality Date  . Dupyetren's contracture    . Salivary gland extraction      Family History  Problem Relation Age of Onset  . Arthritis Mother   . Heart failure  Mother   . Heart disease Father     History   Social History  . Marital Status: Married    Spouse Name: N/A    Number of Children: N/A  . Years of Education: N/A   Occupational History  . Not on file.   Social History Main Topics  . Smoking status: Never Smoker   . Smokeless tobacco: Not on file  . Alcohol Use: No  . Drug Use: No  . Sexual Activity: Not Currently   Other Topics Concern  . Not on file   Social History Narrative  . No narrative on file    Review of systems: The patient specifically denies any chest pain at rest or with exertion, dyspnea at rest or with exertion, orthopnea, paroxysmal nocturnal dyspnea, syncope, palpitations, focal neurological deficits, intermittent claudication, lower extremity edema, unexplained weight gain, cough, hemoptysis or wheezing.  The patient has heartburn and fluid reflux symptoms, but denies abdominal pain, nausea, vomiting, dysphagia, diarrhea, constipation, polyuria, polydipsia, dysuria, hematuria, frequency, urgency, abnormal bleeding or bruising, fever, chills, unexpected weight changes, mood swings, change in skin or hair texture, change in voice quality, auditory or visual problems, allergic reactions or rashes, new musculoskeletal complaints other than usual "aches and pains".  PHYSICAL EXAM BP 124/74  Pulse 59  Resp 16  Ht 5' 6.5" (1.689 m)  Wt 190 lb 11.2 oz (86.501 kg)  BMI 30.32 kg/m2  General: Alert, oriented x3, no distress Head: no evidence of trauma, PERRL, EOMI, no exophtalmos or lid lag, no myxedema, no xanthelasma; normal ears, nose and oropharynx Neck: normal jugular venous pulsations and no hepatojugular reflux; brisk carotid pulses without delay and no carotid bruits Chest: clear to auscultation, no signs of consolidation by percussion or palpation, normal fremitus, symmetrical and full respiratory excursions Cardiovascular: normal position and quality of the apical impulse, regular rhythm, normal first  and second heart sounds, no murmurs, rubs or gallops Abdomen: no tenderness or distention, no masses by palpation, no abnormal pulsatility or arterial bruits, normal bowel sounds, no hepatosplenomegaly Extremities: no clubbing, cyanosis or edema; 2+ radial, ulnar and brachial pulses bilaterally; 2+ right femoral, posterior tibial and dorsalis pedis pulses; 2+ left femoral, posterior tibial and dorsalis pedis pulses; no subclavian or femoral bruits. Scar of Dupuytren's release L hand Neurological: grossly nonfocal    EKG: .sinus brady, normal  Lipid Panel  No results found for this basename: chol, trig, hdl, cholhdl, vldl, ldlcalc    BMET    Component Value Date/Time   NA 137 10/03/2008 1251   K 3.8 10/03/2008 1251   CL 105 10/03/2008 1251   CO2 27 10/03/2008 1251   GLUCOSE 91 10/03/2008 1251   BUN 14 10/03/2008 1251   CREATININE 1.2 10/03/2008 1251   CALCIUM 9.5 10/03/2008 1251     ASSESSMENT AND PLAN Chest pain at rest His symptoms have none of the typical hallmarks of coronary insufficiency. The symptoms occur at rest and are not in any way worsened by fairly intense physical activity. His symptoms are relieved by antacids and belching. Omeprazole diminishes the frequency of events. He has had similar complaints for years. A previous stress test was "false positive" with normal coronary angiography findings.  G E R D His chest discomfort is most likely related to GERD. Previously, had improvement with twice daily omeprazole (and not much better with Nexium).  HYPERTENSION Well controlled  Obstructive sleep apnea CPAP compliant   Will increase his PPI to twice daily. If symptoms do not resolve or worsen, schedule for treadmill nuclear perfusion study (Myoview.)   Orders Placed This Encounter  Procedures  . EKG 12-Lead   Meds ordered this encounter  Medications  . fluticasone (FLONASE) 50 MCG/ACT nasal spray    Sig: as needed.  Marland Kitchen NITROSTAT 0.4 MG SL tablet    Sig: Place 0.4 mg  under the tongue every 5 (five) minutes as needed.   Marland Kitchen omeprazole (PRILOSEC) 20 MG capsule    Sig: Take 1 capsule (20 mg total) by mouth 2 (two) times daily.    Dispense:  60 capsule    Refill:  6    Azlynn Mitnick  Thurmon Fair, MD, [facility] [phone] office [phone] pager

## 2013-01-15 NOTE — Assessment & Plan Note (Signed)
Well controlled 

## 2013-01-15 NOTE — Patient Instructions (Addendum)
Increase the Omeprazole to twice a day.  A new prescription has been sent to your pharmacy.  Your physician recommends that you schedule a follow-up appointment in: 6 months.

## 2013-01-15 NOTE — Assessment & Plan Note (Signed)
His chest discomfort is most likely related to GERD. Previously, had improvement with twice daily omeprazole (and not much better with Nexium).

## 2013-02-06 ENCOUNTER — Encounter: Payer: Self-pay | Admitting: Cardiovascular Disease

## 2013-02-11 DIAGNOSIS — Z23 Encounter for immunization: Secondary | ICD-10-CM | POA: Diagnosis not present

## 2013-03-21 ENCOUNTER — Ambulatory Visit: Payer: Medicare Other | Admitting: Oncology

## 2013-03-21 ENCOUNTER — Other Ambulatory Visit: Payer: Medicare Other | Admitting: Lab

## 2013-03-26 ENCOUNTER — Encounter (INDEPENDENT_AMBULATORY_CARE_PROVIDER_SITE_OTHER): Payer: Self-pay

## 2013-03-26 ENCOUNTER — Telehealth: Payer: Self-pay | Admitting: Oncology

## 2013-03-26 ENCOUNTER — Encounter: Payer: Self-pay | Admitting: Oncology

## 2013-03-26 ENCOUNTER — Ambulatory Visit (HOSPITAL_BASED_OUTPATIENT_CLINIC_OR_DEPARTMENT_OTHER): Payer: Medicare Other | Admitting: Oncology

## 2013-03-26 ENCOUNTER — Other Ambulatory Visit (HOSPITAL_BASED_OUTPATIENT_CLINIC_OR_DEPARTMENT_OTHER): Payer: Medicare Other | Admitting: Lab

## 2013-03-26 VITALS — BP 119/74 | HR 70 | Temp 98.2°F | Resp 18 | Ht 66.0 in | Wt 186.5 lb

## 2013-03-26 DIAGNOSIS — D649 Anemia, unspecified: Secondary | ICD-10-CM

## 2013-03-26 DIAGNOSIS — D509 Iron deficiency anemia, unspecified: Secondary | ICD-10-CM

## 2013-03-26 DIAGNOSIS — D72819 Decreased white blood cell count, unspecified: Secondary | ICD-10-CM | POA: Diagnosis not present

## 2013-03-26 LAB — CBC WITH DIFFERENTIAL/PLATELET
BASO%: 0.7 % (ref 0.0–2.0)
Eosinophils Absolute: 0 10*3/uL (ref 0.0–0.5)
HCT: 45.4 % (ref 38.4–49.9)
LYMPH%: 28.3 % (ref 14.0–49.0)
MCHC: 34 g/dL (ref 32.0–36.0)
MONO#: 0.3 10*3/uL (ref 0.1–0.9)
NEUT%: 62.4 % (ref 39.0–75.0)
Platelets: 166 10*3/uL (ref 140–400)
WBC: 4.2 10*3/uL (ref 4.0–10.3)

## 2013-03-26 LAB — IRON AND TIBC CHCC
%SAT: 37 % (ref 20–55)
TIBC: 289 ug/dL (ref 202–409)
UIBC: 181 ug/dL (ref 117–376)

## 2013-03-26 NOTE — Telephone Encounter (Signed)
, °

## 2013-03-26 NOTE — Progress Notes (Signed)
OFFICE PROGRESS NOTE  CC  Seth Gray [facility] [address] [place] [address]  DIAGNOSIS: 77 year old gentleman with mild leukopenia and hypoferritinemia  PRIOR THERAPY:  #1 leukopenia: Observation  #2hypoferritinemia: Oral iron as needed  CURRENT THERAPY: Observation  INTERVAL HISTORY: Seth Gray 77 y.o. male returns for followup visit today. Overall he is doing well no real complaints. He has not had any infections no hospitalizations. He denies any nausea vomiting abdominal pain. No myalgias and arthralgias no rashes. Remainder of the 10 point review of systems is negative.  MEDICAL HISTORY: Past Medical History  Diagnosis Date  . Hypertension   . Sleep apnea   . ALLERGIC RHINITIS   . GERD (gastroesophageal reflux disease)   . Leukopenia 03/20/2012    ALLERGIES:  is allergic to amoxicillin and sulfonamide derivatives.  MEDICATIONS:  Current Outpatient Prescriptions  Medication Sig Dispense Refill  . aspirin 81 MG tablet Take 81 mg by mouth daily.      . ferrous sulfate 325 (65 FE) MG tablet Take 325 mg by mouth daily with breakfast.      . fluticasone (FLONASE) 50 MCG/ACT nasal spray as needed.      . hydrochlorothiazide (HYDRODIURIL) 25 MG tablet Take 1/2 tablet by mouth daily      . omeprazole (PRILOSEC) 20 MG capsule Take 1 capsule (20 mg total) by mouth 2 (two) times daily.  60 capsule  6  . diphenhydrAMINE (BENADRYL) 25 MG tablet Take 25 mg by mouth as needed for itching.      . loratadine (CLARITIN) 10 MG tablet Take 10 mg by mouth daily.      Marland Kitchen NITROSTAT 0.4 MG SL tablet Place 0.4 mg under the tongue every 5 (five) minutes as needed.        No current facility-administered medications for this visit.    SURGICAL HISTORY:  Past Surgical History  Procedure Laterality Date  . Dupyetren's contracture    . Salivary gland extraction      REVIEW OF SYSTEMS:  Pertinent items are noted in HPI.   HEALTH MAINTENANCE:  PHYSICAL  EXAMINATION: Blood pressure 119/74, pulse 70, temperature 98.2 F (36.8 C), temperature source Oral, resp. rate 18, height 5\' 6"  (1.676 m), weight 186 lb 8 oz (84.596 kg). Body mass index is 30.12 kg/(m^2). ECOG PERFORMANCE STATUS: 0 - Asymptomatic Well-developed well-nourished male in no acute distress HEENT exam EOMI PERRLA sclerae anicteric no conjunctival pallor oral mucosa is moist no thrush no mucositis neck is supple lungs are clear cardiovascular regular rate rhythm abdomen is soft extremities no edema neuro is nonfocal   LABORATORY DATA: Lab Results  Component Value Date   WBC 4.2 03/26/2013   HGB 15.5 03/26/2013   HCT 45.4 03/26/2013   MCV 96.2 03/26/2013   PLT 166 03/26/2013      Chemistry      Component Value Date/Time   NA 137 10/03/2008 1251   K 3.8 10/03/2008 1251   CL 105 10/03/2008 1251   CO2 27 10/03/2008 1251   BUN 14 10/03/2008 1251   CREATININE 1.2 10/03/2008 1251      Component Value Date/Time   CALCIUM 9.5 10/03/2008 1251   ALKPHOS 68 10/03/2008 1251   AST 22 10/03/2008 1251   ALT 23 10/03/2008 1251   BILITOT 0.70 10/03/2008 1251       RADIOGRAPHIC STUDIES:  No results found.  ASSESSMENT: 77 year old gentleman with leukopenia and low ferritin. He is on observation for leukopenia. For his low ferritin he  does take oral iron as needed. His CBC looks terrific today we will check up on his iron studies.   PLAN: He will continue to followup with me on a yearly basis   All questions were answered. The patient knows to call the clinic with any problems, questions or concerns. We can certainly see the patient much sooner if necessary.  I spent 15 minutes counseling the patient face to face. The total time spent in the appointment was 30 minutes.    Drue Second, MD Medical/[facility] [phone] (beeper) [phone] (Office)  03/26/2013, 10:34 AM

## 2013-03-28 ENCOUNTER — Telehealth: Payer: Self-pay | Admitting: *Deleted

## 2013-03-28 NOTE — Telephone Encounter (Signed)
Message copied by Cooper Render on Wed Mar 28, 2013  8:36 AM ------      Message from: Illa Level      Created: Tue Mar 27, 2013 12:52 PM       Call patient and inform that iron studies are good.              Thanks,       L       ----- Message -----         From: [facility] Interface         Sent: 03/26/2013   9:31 AM           To: Victorino December, MD                   ------

## 2013-03-28 NOTE — Telephone Encounter (Signed)
Per NP notified pt iron studies are good. LMOVM. Requested pt call back to confirm message rcvd.

## 2013-04-20 DIAGNOSIS — L570 Actinic keratosis: Secondary | ICD-10-CM | POA: Diagnosis not present

## 2013-05-14 DIAGNOSIS — H52229 Regular astigmatism, unspecified eye: Secondary | ICD-10-CM | POA: Diagnosis not present

## 2013-05-14 DIAGNOSIS — H5210 Myopia, unspecified eye: Secondary | ICD-10-CM | POA: Diagnosis not present

## 2013-05-14 DIAGNOSIS — Z961 Presence of intraocular lens: Secondary | ICD-10-CM | POA: Diagnosis not present

## 2013-05-14 DIAGNOSIS — H04129 Dry eye syndrome of unspecified lacrimal gland: Secondary | ICD-10-CM | POA: Diagnosis not present

## 2013-07-26 ENCOUNTER — Ambulatory Visit: Payer: Medicare Other | Admitting: Internal Medicine

## 2013-08-02 DIAGNOSIS — R42 Dizziness and giddiness: Secondary | ICD-10-CM | POA: Diagnosis not present

## 2013-08-07 ENCOUNTER — Encounter: Payer: Self-pay | Admitting: *Deleted

## 2013-08-10 ENCOUNTER — Telehealth: Payer: Self-pay | Admitting: Cardiovascular Disease

## 2013-08-13 ENCOUNTER — Ambulatory Visit: Payer: Medicare Other | Admitting: Cardiovascular Disease

## 2013-08-16 ENCOUNTER — Encounter: Payer: Self-pay | Admitting: Internal Medicine

## 2013-08-16 ENCOUNTER — Ambulatory Visit (INDEPENDENT_AMBULATORY_CARE_PROVIDER_SITE_OTHER): Payer: Medicare Other | Admitting: Internal Medicine

## 2013-08-16 ENCOUNTER — Encounter (INDEPENDENT_AMBULATORY_CARE_PROVIDER_SITE_OTHER): Payer: Self-pay

## 2013-08-16 VITALS — BP 118/78 | HR 59 | Ht 66.0 in | Wt 190.8 lb

## 2013-08-16 DIAGNOSIS — G4733 Obstructive sleep apnea (adult) (pediatric): Secondary | ICD-10-CM | POA: Diagnosis not present

## 2013-08-16 NOTE — Patient Instructions (Signed)
Order- [facility]- Replacement CPAP mask of choice and supplies as needed    Dx OSA  Please call as needed

## 2013-08-16 NOTE — Progress Notes (Signed)
06/23/11- 75 yoM never smoker followed for OSA, allergic rhinitis   LOV- 06/22/10 The heat of trying to get a humidifier for his CPAP machine which is 69 or 76 years old. [facility] is working with him. Pressure it 8 CWP. He is comfortable and using it all night every night. Perennial nasal congestion is under good control now. He uses occasional Benadryl and we compared that to nonsedating meds. We discussed the pending spring pollen season.  07/25/12- 76 yoM never smoker followed for OSA, allergic rhinitis FOLLOWS FOR: wears CPAP every night for about 6-8 hours and pressure working well Has new CPAP machine 8/[facility], but still using his old machine because he says it works fine. He thinks he is sleeping well. He considers his mild seasonal allergic rhinitis to be only a nuisance. We discussed antihistamines if needed.  08/16/13- 63 yoM never smoker followed for OSA, allergic rhinitis complicated by GERD, Leukopenia CPAP 8/[facility]       PCP [place]: Pt stated he is sleeping alright. Wearing CPAP 8/ [facility] nightly for 7-8 hours. Denies issue with machine and pressure. Pt states the nasal pillows are begining to wear- needs replacement.   ROS-see HPI Constitutional:   No-   weight loss, night sweats, fevers, chills, fatigue, lassitude. HEENT:   No-  headaches, difficulty swallowing, tooth/dental problems, sore throat,       +Little  sneezing, itching, ear ache, nasal congestion, post nasal drip,  CV:  No-   chest pain, orthopnea, PND, swelling in lower extremities, anasarca, dizziness, palpitations Resp: No-   shortness of breath with exertion or at rest.              No-   productive cough,  No non-productive cough,  No- coughing up of blood.              No-   change in color of mucus.  No- wheezing.   Skin: No-   rash or lesions. GI:  No-   heartburn, indigestion, abdominal pain, nausea, vomiting,  GU: MS:  No-   joint pain or swelling.  . Neuro-     +occasional  dizziness Psych:  No- change in mood or affect. No depression or anxiety.  No memory loss.  OBJ- Physical Exam General- Alert, Oriented, Affect-appropriate, Distress- none acute, overweight Skin- rash-none, lesions- none, excoriation- none Lymphadenopathy- none Head- atraumatic            Eyes- Gross vision intact, PERRLA, conjunctivae and secretions clear            Ears- Hearing aid            Nose- Clear, no-Septal dev, mucus, polyps, erosion, perforation             Throat- Mallampati III mucosa clear , drainage- none, tonsils- atrophic. + Upper plate, lower partial. Neck- flexible , trachea midline, no stridor , thyroid nl, carotid no bruit Chest - symmetrical excursion , unlabored           Heart/CV- RRR , no murmur , no gallop  , no rub, nl s1 s2                           - JVD- none , edema- none, stasis changes- none, varices- none           Lung- clear to P&A, wheeze- none, cough- none , dullness-none, rub- none  Chest wall-  Abd- Br/ Gen/ Rectal- Not done, not indicated Extrem- cyanosis- none, clubbing, none, atrophy- none, strength- nl Neuro- grossly intact to observation. No carotid bruit, no nystagmus

## 2013-08-21 DIAGNOSIS — Z0000 Encounter for general adult medical examination without abnormal findings: Secondary | ICD-10-CM | POA: Diagnosis not present

## 2013-08-21 DIAGNOSIS — I10 Essential (primary) hypertension: Secondary | ICD-10-CM | POA: Diagnosis not present

## 2013-08-21 DIAGNOSIS — R413 Other amnesia: Secondary | ICD-10-CM | POA: Diagnosis not present

## 2013-08-21 DIAGNOSIS — E781 Pure hyperglyceridemia: Secondary | ICD-10-CM | POA: Diagnosis not present

## 2013-08-21 DIAGNOSIS — Z79899 Other long term (current) drug therapy: Secondary | ICD-10-CM | POA: Diagnosis not present

## 2013-08-21 DIAGNOSIS — Z125 Encounter for screening for malignant neoplasm of prostate: Secondary | ICD-10-CM | POA: Diagnosis not present

## 2013-08-21 DIAGNOSIS — K219 Gastro-esophageal reflux disease without esophagitis: Secondary | ICD-10-CM | POA: Diagnosis not present

## 2013-08-23 NOTE — Telephone Encounter (Signed)
Closed encounter °

## 2013-09-06 DIAGNOSIS — M25569 Pain in unspecified knee: Secondary | ICD-10-CM | POA: Diagnosis not present

## 2013-09-16 NOTE — Assessment & Plan Note (Signed)
Controlled obstructive apnea- good compliance and control Plan-[facility] replace CPAP mask and supplies

## 2013-09-21 ENCOUNTER — Encounter: Payer: Self-pay | Admitting: Cardiovascular Disease

## 2013-09-21 ENCOUNTER — Ambulatory Visit (INDEPENDENT_AMBULATORY_CARE_PROVIDER_SITE_OTHER): Payer: Medicare Other | Admitting: Cardiovascular Disease

## 2013-09-21 VITALS — BP 116/72 | HR 53 | Ht 67.0 in | Wt 191.0 lb

## 2013-09-21 DIAGNOSIS — I10 Essential (primary) hypertension: Secondary | ICD-10-CM

## 2013-09-21 NOTE — Progress Notes (Signed)
Patient ID: Seth Gray, male   DOB: 06-06-1935, 78 y.o.   MRN: [id number]   Reason for office visit Chest pain evaluation  Mr. Hjort is an active 78 year old M who presents for a routine follow up of chest pain. This was previously evaluated in September 2014 and thought to be very atypical and low risk for cardiac etiology. More likely, the discomfort was GI related. He had normal coronary angiography in 2001. He denies any recent chest pain. He does not have shortness of breath and is only limited in any of his activities by left knee pain. He has not required any nitroglycerin. The patient states that his lipids have been checked through his PCP, but we do not have records of that today.     Allergies  Allergen Reactions  . Amoxicillin     REACTION: rash  . Sulfonamide Derivatives     Current Outpatient Prescriptions  Medication Sig Dispense Refill  . aspirin 81 MG tablet Take 81 mg by mouth daily.      . ferrous sulfate 325 (65 FE) MG tablet Take 325 mg by mouth daily with breakfast.      . hydrochlorothiazide (HYDRODIURIL) 25 MG tablet Take 1/2 tablet by mouth daily      . meclizine (ANTIVERT) 25 MG tablet Take 1 tablet by mouth as needed.      . meloxicam (MOBIC) 15 MG tablet Take 15 mg by mouth daily as needed.      Marland Kitchen NITROSTAT 0.4 MG SL tablet Place 0.4 mg under the tongue every 5 (five) minutes as needed.       Marland Kitchen omeprazole (PRILOSEC) 20 MG capsule Take 20 mg by mouth daily.       No current facility-administered medications for this visit.    Past Medical History  Diagnosis Date  . Hypertension   . Sleep apnea   . ALLERGIC RHINITIS   . GERD (gastroesophageal reflux disease)   . Leukopenia 03/20/2012    Past Surgical History  Procedure Laterality Date  . Dupyetren's contracture    . Salivary gland extraction      Family History  Problem Relation Age of Onset  . Arthritis Mother   . Heart failure Mother   . Heart disease Father     History   Social  History  . Marital Status: Married    Spouse Name: N/A    Number of Children: N/A  . Years of Education: N/A   Occupational History  . Not on file.   Social History Main Topics  . Smoking status: Never Smoker   . Smokeless tobacco: Not on file  . Alcohol Use: No  . Drug Use: No  . Sexual Activity: Not Currently   Other Topics Concern  . Not on file   Social History Narrative  . No narrative on file    Review of systems: The patient specifically denies any chest pain at rest or with exertion, dyspnea at rest or with exertion, orthopnea, paroxysmal nocturnal dyspnea, syncope, palpitations, focal neurological deficits, intermittent claudication, lower extremity edema, unexplained weight gain, cough, hemoptysis or wheezing.  The patient has heartburn and fluid reflux symptoms, but denies abdominal pain, nausea, vomiting, dysphagia, diarrhea, constipation, polyuria, polydipsia, dysuria, hematuria, frequency, urgency, abnormal bleeding or bruising, fever, chills, unexpected weight changes, mood swings, change in skin or hair texture, change in voice quality, auditory or visual problems, allergic reactions or rashes, new musculoskeletal complaints other than usual "aches and pains".   PHYSICAL EXAM  BP 116/72  Pulse 53  Ht 5\' 7"  (1.702 m)  Wt 191 lb (86.637 kg)  BMI 29.91 kg/m2  General: Alert, oriented x3, no distress Head: no evidence of trauma, PERRL, EOMI, no exophtalmos or lid lag, no myxedema, no xanthelasma; normal ears, nose and oropharynx Neck: normal jugular venous pulsations and no hepatojugular reflux; brisk carotid pulses without delay and no carotid bruits Chest: clear to auscultation, no signs of consolidation by percussion or palpation, normal fremitus, symmetrical and full respiratory excursions Cardiovascular: normal position and quality of the apical impulse, regular rhythm, normal first and second heart sounds, no murmurs, rubs or gallops Abdomen: no tenderness or  distention, no masses by palpation, no abnormal pulsatility or arterial bruits, normal bowel sounds, no hepatosplenomegaly Extremities: no clubbing, cyanosis or edema; 2+ radial, ulnar and brachial pulses bilaterally; 2+ right femoral, posterior tibial and dorsalis pedis pulses; 2+ left femoral, posterior tibial and dorsalis pedis pulses; no subclavian or femoral bruits. Scar of Dupuytren's release L hand Neurological: grossly nonfocal    EKG: sinus brady to 53, normal  Lipid Panel  No results found for this basename: chol,  trig,  hdl,  cholhdl,  vldl,  ldlcalc    BMET    Component Value Date/Time   NA 137 10/03/2008 1251   K 3.8 10/03/2008 1251   CL 105 10/03/2008 1251   CO2 27 10/03/2008 1251   GLUCOSE 91 10/03/2008 1251   BUN 14 10/03/2008 1251   CREATININE 1.2 10/03/2008 1251   CALCIUM 9.5 10/03/2008 1251     ASSESSMENT AND PLAN No problem-specific assessment & plan notes found for this encounter.   Continue aspirin, HCTZ, and PPI. Obtain records of lipid profile from PCP. Follow up in 1 year.   Orders Placed This Encounter  Procedures  . EKG 12-Lead   Meds ordered this encounter  Medications  . meloxicam (MOBIC) 15 MG tablet    Sig: Take 15 mg by mouth daily as needed.    Angelica Ran, MD  I have seen and examined the patient along with Angelica Ran, MD.  I have reviewed the chart, notes and new data.  I agree with his note.   Well controlled coronary risk factors except borderline obesity. Chest symptoms responded to PPI. Yearly follow up.  Sanda Klein, MD, [place] [phone] 09/21/2013, 5:50 PM  Sanda Klein, MD, [facility] [place] [phone] office [phone] pager

## 2013-09-21 NOTE — Patient Instructions (Addendum)
Dr Croitoru wants you to follow-up in 1 year. You will receive a reminder letter in the mail one months in advance. If you don't receive a letter, please call our office to schedule the follow-up appointment. 

## 2013-10-27 ENCOUNTER — Other Ambulatory Visit: Payer: Self-pay | Admitting: Cardiovascular Disease

## 2013-10-29 DIAGNOSIS — K5730 Diverticulosis of large intestine without perforation or abscess without bleeding: Secondary | ICD-10-CM | POA: Diagnosis not present

## 2013-10-29 DIAGNOSIS — Z86010 Personal history of colonic polyps: Secondary | ICD-10-CM | POA: Diagnosis not present

## 2013-10-29 DIAGNOSIS — Z09 Encounter for follow-up examination after completed treatment for conditions other than malignant neoplasm: Secondary | ICD-10-CM | POA: Diagnosis not present

## 2013-10-29 NOTE — Telephone Encounter (Signed)
Rx refill sent to patient pharmacy   

## 2013-11-16 DIAGNOSIS — M25569 Pain in unspecified knee: Secondary | ICD-10-CM | POA: Diagnosis not present

## 2014-01-01 DIAGNOSIS — T148 Other injury of unspecified body region: Secondary | ICD-10-CM | POA: Diagnosis not present

## 2014-01-15 DIAGNOSIS — L570 Actinic keratosis: Secondary | ICD-10-CM | POA: Diagnosis not present

## 2014-01-15 DIAGNOSIS — L538 Other specified erythematous conditions: Secondary | ICD-10-CM | POA: Diagnosis not present

## 2014-01-15 DIAGNOSIS — L578 Other skin changes due to chronic exposure to nonionizing radiation: Secondary | ICD-10-CM | POA: Diagnosis not present

## 2014-01-15 DIAGNOSIS — L821 Other seborrheic keratosis: Secondary | ICD-10-CM | POA: Diagnosis not present

## 2014-01-26 ENCOUNTER — Telehealth: Payer: Self-pay | Admitting: Hematology and Oncology

## 2014-01-26 NOTE — Telephone Encounter (Signed)
s/w both pt/wife re new appt for lb/vg 11/20 @ 8:45am.

## 2014-02-10 DIAGNOSIS — Z23 Encounter for immunization: Secondary | ICD-10-CM | POA: Diagnosis not present

## 2014-02-26 DIAGNOSIS — M7551 Bursitis of right shoulder: Secondary | ICD-10-CM | POA: Diagnosis not present

## 2014-02-26 DIAGNOSIS — K219 Gastro-esophageal reflux disease without esophagitis: Secondary | ICD-10-CM | POA: Diagnosis not present

## 2014-02-26 DIAGNOSIS — K529 Noninfective gastroenteritis and colitis, unspecified: Secondary | ICD-10-CM | POA: Diagnosis not present

## 2014-02-26 DIAGNOSIS — Z23 Encounter for immunization: Secondary | ICD-10-CM | POA: Diagnosis not present

## 2014-02-26 DIAGNOSIS — I10 Essential (primary) hypertension: Secondary | ICD-10-CM | POA: Diagnosis not present

## 2014-03-21 ENCOUNTER — Other Ambulatory Visit: Payer: Self-pay

## 2014-03-21 DIAGNOSIS — D72819 Decreased white blood cell count, unspecified: Secondary | ICD-10-CM

## 2014-03-21 DIAGNOSIS — R790 Abnormal level of blood mineral: Secondary | ICD-10-CM

## 2014-03-21 DIAGNOSIS — D509 Iron deficiency anemia, unspecified: Secondary | ICD-10-CM

## 2014-03-22 ENCOUNTER — Ambulatory Visit (HOSPITAL_BASED_OUTPATIENT_CLINIC_OR_DEPARTMENT_OTHER): Payer: Medicare Other | Admitting: Hematology and Oncology

## 2014-03-22 ENCOUNTER — Other Ambulatory Visit (HOSPITAL_BASED_OUTPATIENT_CLINIC_OR_DEPARTMENT_OTHER): Payer: Medicare Other

## 2014-03-22 VITALS — BP 124/66 | HR 65 | Temp 97.5°F | Resp 18 | Ht 67.0 in | Wt 188.0 lb

## 2014-03-22 DIAGNOSIS — E611 Iron deficiency: Secondary | ICD-10-CM

## 2014-03-22 DIAGNOSIS — R790 Abnormal level of blood mineral: Secondary | ICD-10-CM

## 2014-03-22 DIAGNOSIS — D72819 Decreased white blood cell count, unspecified: Secondary | ICD-10-CM

## 2014-03-22 DIAGNOSIS — D509 Iron deficiency anemia, unspecified: Secondary | ICD-10-CM

## 2014-03-22 LAB — CBC WITH DIFFERENTIAL/PLATELET
BASO%: 0.6 % (ref 0.0–2.0)
Basophils Absolute: 0 10*3/uL (ref 0.0–0.1)
EOS ABS: 0 10*3/uL (ref 0.0–0.5)
EOS%: 0.8 % (ref 0.0–7.0)
HCT: 44.8 % (ref 38.4–49.9)
HGB: 15.8 g/dL (ref 13.0–17.1)
LYMPH%: 31.3 % (ref 14.0–49.0)
MCH: 33 pg (ref 27.2–33.4)
MCHC: 35.3 g/dL (ref 32.0–36.0)
MCV: 93.5 fL (ref 79.3–98.0)
MONO#: 0.3 10*3/uL (ref 0.1–0.9)
MONO%: 9.3 % (ref 0.0–14.0)
NEUT#: 2.1 10*3/uL (ref 1.5–6.5)
NEUT%: 58 % (ref 39.0–75.0)
PLATELETS: 173 10*3/uL (ref 140–400)
RBC: 4.79 10*6/uL (ref 4.20–5.82)
RDW: 12.6 % (ref 11.0–14.6)
WBC: 3.6 10*3/uL — ABNORMAL LOW (ref 4.0–10.3)
lymph#: 1.1 10*3/uL (ref 0.9–3.3)

## 2014-03-22 LAB — IRON AND TIBC CHCC
%SAT: 39 % (ref 20–55)
IRON: 114 ug/dL (ref 42–163)
TIBC: 290 ug/dL (ref 202–409)
UIBC: 176 ug/dL (ref 117–376)

## 2014-03-22 LAB — FERRITIN CHCC: FERRITIN: 77 ng/mL (ref 22–316)

## 2014-03-22 NOTE — Progress Notes (Signed)
Patient Care Team: Cyndi Bender, PA-C as PCP - General (Physician Assistant)  DIAGNOSIS: 78 year old gentleman with mild leukopenia and hypoferritinemia  PRIOR THERAPY:  #1 leukopenia: Observation  #2hypoferritinemia: Oral iron  CURRENT THERAPY: oral iron once daily CHIEF COMPLIANT: episode of bright red blood per rectum  INTERVAL HISTORY: SEMISI Gray is a 78 year old Caucasian gentleman with above-mentioned history of mild leukopenia and low ferritin levels. He has been on hormone therapy for a long time. He reports that whenever he stopped iron in the past, his ferritin levels have declined and he is to resume oral iron treatment. It was no new problems or concerns other than the record blood per rectum. He denies any frequent infections or problems related to low white count.  REVIEW OF SYSTEMS:   Constitutional: Denies fevers, chills or abnormal weight loss Eyes: Denies blurriness of vision Ears, nose, mouth, throat, and face: Denies mucositis or sore throat Respiratory: Denies cough, dyspnea or wheezes Cardiovascular: Denies palpitation, chest discomfort or lower extremity swelling Gastrointestinal:  Episode of bright red blood per rectum Skin: Denies abnormal skin rashes Lymphatics: Denies new lymphadenopathy or easy bruising Neurological:Denies numbness, tingling or new weaknesses Behavioral/Psych: Mood is stable, no new changes  All other systems were reviewed with the patient and are negative.  I have reviewed the past medical history, past surgical history, social history and family history with the patient and they are unchanged from previous note.  ALLERGIES:  is allergic to amoxicillin and sulfonamide derivatives.  MEDICATIONS:  Current Outpatient Prescriptions  Medication Sig Dispense Refill  . aspirin 81 MG tablet Take 81 mg by mouth daily.    . ferrous sulfate 325 (65 FE) MG tablet Take 325 mg by mouth daily with breakfast.    . hydrochlorothiazide  (HYDRODIURIL) 25 MG tablet Take 1/2 tablet by mouth daily    . meclizine (ANTIVERT) 25 MG tablet Take 1 tablet by mouth as needed.    . meloxicam (MOBIC) 15 MG tablet Take 15 mg by mouth daily as needed.    Marland Kitchen NITROSTAT 0.4 MG SL tablet Place 0.4 mg under the tongue every 5 (five) minutes as needed.     Marland Kitchen omeprazole (PRILOSEC) 20 MG capsule Take 20 mg by mouth daily.     No current facility-administered medications for this visit.    PHYSICAL EXAMINATION: ECOG PERFORMANCE STATUS: 1 - Symptomatic but completely ambulatory  Filed Vitals:   03/22/14 0852  BP: 124/66  Pulse: 65  Temp: 97.5 F (36.4 C)  Resp: 18   Filed Weights   03/22/14 0852  Weight: 188 lb (85.276 kg)    GENERAL:alert, no distress and comfortable SKIN: skin color, texture, turgor are normal, no rashes or significant lesions EYES: normal, Conjunctiva are pink and non-injected, sclera clear OROPHARYNX:no exudate, no erythema and lips, buccal mucosa, and tongue normal  NECK: supple, thyroid normal size, non-tender, without nodularity LYMPH:  no palpable lymphadenopathy in the cervical, axillary or inguinal LUNGS: clear to auscultation and percussion with normal breathing effort HEART: regular rate & rhythm and no murmurs and no lower extremity edema ABDOMEN:abdomen soft, non-tender and normal bowel sounds Musculoskeletal:no cyanosis of digits and no clubbing  NEURO: alert & oriented x 3 with fluent speech, no focal motor/sensory deficits  LABORATORY DATA:  I have reviewed the data as listed   Chemistry      Component Value Date/Time   NA 137 10/03/2008 1251   K 3.8 10/03/2008 1251   CL 105 10/03/2008 1251   CO2 27 10/03/2008  1251   BUN 14 10/03/2008 1251   CREATININE 1.2 10/03/2008 1251      Component Value Date/Time   CALCIUM 9.5 10/03/2008 1251   ALKPHOS 68 10/03/2008 1251   AST 22 10/03/2008 1251   ALT 23 10/03/2008 1251   BILITOT 0.70 10/03/2008 1251       Lab Results  Component Value Date    WBC 3.6* 03/22/2014   HGB 15.8 03/22/2014   HCT 44.8 03/22/2014   MCV 93.5 03/22/2014   PLT 173 03/22/2014   NEUTROABS 2.1 03/22/2014   ASSESSMENT & PLAN:  Leukopenia Mild leukopenia: I reviewed his blood work with the patient which showed a white count of 3.6. Absolute neutrophil count is normal at 2.1. I do not believe there is a primary hematological disorder. He has remained stable over a long period of time. Hence I discussed whether he would like to follow with his family physician and see [place] if needed. Patient was keen on doing that.  Iron deficiency: Patient does not have anemia with a good hemoglobin 15.4. He currently takes oral iron once a day. I recommended that he might need to stay on it indefinitely given the fact that in the past whenever he came upon him his ferritin had declined. He recently had an episode of bright red blood per rectum the serosal after he had a colonoscopy as well. He is seeing gastroenterology for this. I suspect he may have AV malformations that cause episodic bleeding that are contributing to iron deficiency.  Return to clinic as needed   No orders of the defined types were placed in this encounter.   The patient has a good understanding of the overall plan. he agrees with it. She will call with any problems that may develop before her next visit here.   Seth Eisenmenger, MD 03/22/2014 9:56 AM

## 2014-03-22 NOTE — Assessment & Plan Note (Signed)
Mild leukopenia: I reviewed his blood work with the patient which showed a white count of 3.6. Absolute neutrophil count is normal at 2.1. I do not believe there is a primary hematological disorder. He has remained stable over a long period of time. Hence I discussed whether he would like to follow with his family physician and see [place] if needed. Patient was keen on doing that.  Iron deficiency: Patient does not have anemia with a good hemoglobin 15.4. He currently takes oral iron once a day. I recommended that he might need to stay on it indefinitely given the fact that in the past whenever he came upon him his ferritin had declined. He recently had an episode of bright red blood per rectum the serosal after he had a colonoscopy as well. He is seeing gastroenterology for this. I suspect he may have AV malformations that cause episodic bleeding that are contributing to iron deficiency.  Return to clinic as needed

## 2014-05-20 DIAGNOSIS — H5203 Hypermetropia, bilateral: Secondary | ICD-10-CM | POA: Diagnosis not present

## 2014-05-20 DIAGNOSIS — H524 Presbyopia: Secondary | ICD-10-CM | POA: Diagnosis not present

## 2014-05-20 DIAGNOSIS — Z961 Presence of intraocular lens: Secondary | ICD-10-CM | POA: Diagnosis not present

## 2014-05-20 DIAGNOSIS — Z9849 Cataract extraction status, unspecified eye: Secondary | ICD-10-CM | POA: Diagnosis not present

## 2014-05-20 DIAGNOSIS — H04129 Dry eye syndrome of unspecified lacrimal gland: Secondary | ICD-10-CM | POA: Diagnosis not present

## 2014-05-20 DIAGNOSIS — H52223 Regular astigmatism, bilateral: Secondary | ICD-10-CM | POA: Diagnosis not present

## 2014-05-26 ENCOUNTER — Other Ambulatory Visit: Payer: Self-pay | Admitting: Cardiovascular Disease

## 2014-05-29 NOTE — Telephone Encounter (Signed)
Rx(s) sent to pharmacy electronically.  

## 2014-09-03 DIAGNOSIS — I10 Essential (primary) hypertension: Secondary | ICD-10-CM | POA: Diagnosis not present

## 2014-09-03 DIAGNOSIS — Z6830 Body mass index (BMI) 30.0-30.9, adult: Secondary | ICD-10-CM | POA: Diagnosis not present

## 2014-09-03 DIAGNOSIS — K219 Gastro-esophageal reflux disease without esophagitis: Secondary | ICD-10-CM | POA: Diagnosis not present

## 2014-09-03 DIAGNOSIS — E781 Pure hyperglyceridemia: Secondary | ICD-10-CM | POA: Diagnosis not present

## 2014-09-03 DIAGNOSIS — D509 Iron deficiency anemia, unspecified: Secondary | ICD-10-CM | POA: Diagnosis not present

## 2014-09-03 DIAGNOSIS — Z1389 Encounter for screening for other disorder: Secondary | ICD-10-CM | POA: Diagnosis not present

## 2014-09-03 DIAGNOSIS — G4733 Obstructive sleep apnea (adult) (pediatric): Secondary | ICD-10-CM | POA: Diagnosis not present

## 2014-09-03 DIAGNOSIS — D72819 Decreased white blood cell count, unspecified: Secondary | ICD-10-CM | POA: Diagnosis not present

## 2014-09-03 DIAGNOSIS — Z125 Encounter for screening for malignant neoplasm of prostate: Secondary | ICD-10-CM | POA: Diagnosis not present

## 2014-09-18 DIAGNOSIS — S30861A Insect bite (nonvenomous) of abdominal wall, initial encounter: Secondary | ICD-10-CM | POA: Diagnosis not present

## 2014-09-18 DIAGNOSIS — R21 Rash and other nonspecific skin eruption: Secondary | ICD-10-CM | POA: Diagnosis not present

## 2014-10-14 ENCOUNTER — Ambulatory Visit (INDEPENDENT_AMBULATORY_CARE_PROVIDER_SITE_OTHER): Payer: Medicare Other | Admitting: Cardiovascular Disease

## 2014-10-14 ENCOUNTER — Encounter: Payer: Self-pay | Admitting: Cardiovascular Disease

## 2014-10-14 VITALS — BP 138/78 | HR 59 | Resp 16 | Ht 66.0 in | Wt 189.7 lb

## 2014-10-14 DIAGNOSIS — I10 Essential (primary) hypertension: Secondary | ICD-10-CM | POA: Diagnosis not present

## 2014-10-14 DIAGNOSIS — G4733 Obstructive sleep apnea (adult) (pediatric): Secondary | ICD-10-CM

## 2014-10-14 DIAGNOSIS — J310 Chronic rhinitis: Secondary | ICD-10-CM

## 2014-10-14 DIAGNOSIS — R079 Chest pain, unspecified: Secondary | ICD-10-CM

## 2014-10-14 NOTE — Patient Instructions (Signed)
Dr. Croitoru recommends that you schedule a follow-up appointment in: One Year.   

## 2014-10-14 NOTE — Progress Notes (Signed)
Patient ID: Seth Gray, male   DOB: Aug 20, 1935, 79 y.o.   MRN: [id number]      Cardiology Office Note   Date:  10/14/2014   ID:  Seth Gray, DOB 07/01/1935, MRN [id number]  PCP:  Fae Pippin  Cardiologist:   Sanda Klein, MD   Chief Complaint  Patient presents with  . Annual Exam    no chest discomfort, some bilateral ankle swelling      History of Present Illness: Seth Gray is a 79 y.o. male who presents for follow-up for recurrent problems with chest pain in the setting of a strong family history of coronary artery disease. He has actually had a very good year. He works hard in the yard including [employer] and other hard labor. This never causes chest discomfort or significant shortness of breath.   He has HTN and several paternal uncles with early CAD, but no other coronary risk factors. Atypical chest pain has occurred sporadically for more than 10-15 years. Coronary angio in 2001 was normal, after a false positive ECG stress test.Now asymptomatic. He is troubled frequently by GERD.  He has gained some weight and is apologetic about that. He has infrequent and mild ankle swelling. He has no other cardiac complaints. He is compliant with CPAP for obstructive sleep apnea (Dr. Annamaria Boots). He has mild leukopenia that is chronic and sees Dr. Humphrey Rolls but is not yet receiving any treatment.  Past Medical History  Diagnosis Date  . Hypertension   . Sleep apnea   . ALLERGIC RHINITIS   . GERD (gastroesophageal reflux disease)   . Leukopenia 03/20/2012    Past Surgical History  Procedure Laterality Date  . Dupyetren's contracture    . Salivary gland extraction       Current Outpatient Prescriptions  Medication Sig Dispense Refill  . aspirin 81 MG tablet Take 81 mg by mouth daily.    . ferrous sulfate 325 (65 FE) MG tablet Take 325 mg by mouth daily with breakfast.    . hydrochlorothiazide (HYDRODIURIL) 25 MG tablet Take 1/2 tablet by mouth daily    . NITROSTAT 0.4 MG  SL tablet Place 0.4 mg under the tongue every 5 (five) minutes as needed.     Marland Kitchen omeprazole (PRILOSEC) 20 MG capsule Take 20 mg by mouth daily.     No current facility-administered medications for this visit.    Allergies:   Amoxicillin and Sulfonamide derivatives    Social History:  The patient  reports that he has never smoked. He does not have any smokeless tobacco history on file. He reports that he does not drink alcohol or use illicit drugs.   Family History:  The patient's family history includes Arthritis in his mother; Heart disease in his father; Heart failure in his mother.    ROS:  Please see the history of present illness.    Otherwise, review of systems positive for none.   All other systems are reviewed and negative.    PHYSICAL EXAM: VS:  BP 138/78 mmHg  Pulse 59  Resp 16  Ht 5\' 6"  (1.676 m)  Wt 86.047 kg (189 lb 11.2 oz)  BMI 30.63 kg/m2 , BMI Body mass index is 30.63 kg/(m^2).  General: Alert, oriented x3, no distress Head: no evidence of trauma, PERRL, EOMI, no exophtalmos or lid lag, no myxedema, no xanthelasma; normal ears, nose and oropharynx Neck: normal jugular venous pulsations and no hepatojugular reflux; brisk carotid pulses without delay and no carotid bruits Chest: clear to  auscultation, no signs of consolidation by percussion or palpation, normal fremitus, symmetrical and full respiratory excursions Cardiovascular: normal position and quality of the apical impulse, regular rhythm, normal first and second heart sounds, no murmurs, rubs or gallops Abdomen: no tenderness or distention, no masses by palpation, no abnormal pulsatility or arterial bruits, normal bowel sounds, no hepatosplenomegaly Extremities: no clubbing, cyanosis or edema; 2+ radial, ulnar and brachial pulses bilaterally; 2+ right femoral, posterior tibial and dorsalis pedis pulses; 2+ left femoral, posterior tibial and dorsalis pedis pulses; no subclavian or femoral bruits Neurological:  grossly nonfocal Psych: euthymic mood, full affect   EKG:  EKG is ordered today. The ekg ordered today demonstrates sinus bradycardia 59 bpm otherwise normal   Recent Labs: 03/22/2014: HGB 15.8; Platelets 173    Wt Readings from Last 3 Encounters:  10/14/14 86.047 kg (189 lb 11.2 oz)  03/22/14 85.276 kg (188 lb)  09/21/13 86.637 kg (191 lb)     ASSESSMENT AND PLAN:  Seth Gray does not have any cardiac complaints at this time. His blood pressure is well controlled. Advised him to avoid working out in the heat and to stay well hydrated. No changes are recommended to his current medications.   Current medicines are reviewed at length with the patient today.  The patient does not have concerns regarding medicines.  The following changes have been made:  no change  Labs/ tests ordered today include:  No orders of the defined types were placed in this encounter.    Mikael Spray, MD  10/14/2014 4:38 PM    Sanda Klein, MD, [facility] [phone] office [phone] pager

## 2014-11-06 ENCOUNTER — Other Ambulatory Visit: Payer: Self-pay | Admitting: Cardiovascular Disease

## 2014-11-06 NOTE — Telephone Encounter (Signed)
Rx(s) sent to pharmacy electronically.  

## 2015-01-16 DIAGNOSIS — L821 Other seborrheic keratosis: Secondary | ICD-10-CM | POA: Diagnosis not present

## 2015-01-16 DIAGNOSIS — L570 Actinic keratosis: Secondary | ICD-10-CM | POA: Diagnosis not present

## 2015-01-16 DIAGNOSIS — L578 Other skin changes due to chronic exposure to nonionizing radiation: Secondary | ICD-10-CM | POA: Diagnosis not present

## 2015-03-06 DIAGNOSIS — I10 Essential (primary) hypertension: Secondary | ICD-10-CM | POA: Diagnosis not present

## 2015-03-06 DIAGNOSIS — Z23 Encounter for immunization: Secondary | ICD-10-CM | POA: Diagnosis not present

## 2015-03-06 DIAGNOSIS — D509 Iron deficiency anemia, unspecified: Secondary | ICD-10-CM | POA: Diagnosis not present

## 2015-03-06 DIAGNOSIS — K219 Gastro-esophageal reflux disease without esophagitis: Secondary | ICD-10-CM | POA: Diagnosis not present

## 2015-03-06 DIAGNOSIS — G4733 Obstructive sleep apnea (adult) (pediatric): Secondary | ICD-10-CM | POA: Diagnosis not present

## 2015-05-19 DIAGNOSIS — H5203 Hypermetropia, bilateral: Secondary | ICD-10-CM | POA: Diagnosis not present

## 2015-05-19 DIAGNOSIS — Z961 Presence of intraocular lens: Secondary | ICD-10-CM | POA: Diagnosis not present

## 2015-05-19 DIAGNOSIS — H1045 Other chronic allergic conjunctivitis: Secondary | ICD-10-CM | POA: Diagnosis not present

## 2015-05-19 DIAGNOSIS — H52223 Regular astigmatism, bilateral: Secondary | ICD-10-CM | POA: Diagnosis not present

## 2015-05-19 DIAGNOSIS — H524 Presbyopia: Secondary | ICD-10-CM | POA: Diagnosis not present

## 2015-05-19 DIAGNOSIS — Z9849 Cataract extraction status, unspecified eye: Secondary | ICD-10-CM | POA: Diagnosis not present

## 2015-06-26 DIAGNOSIS — Z9181 History of falling: Secondary | ICD-10-CM | POA: Diagnosis not present

## 2015-06-26 DIAGNOSIS — M5416 Radiculopathy, lumbar region: Secondary | ICD-10-CM | POA: Diagnosis not present

## 2015-06-26 DIAGNOSIS — E669 Obesity, unspecified: Secondary | ICD-10-CM | POA: Diagnosis not present

## 2015-06-26 DIAGNOSIS — Z6830 Body mass index (BMI) 30.0-30.9, adult: Secondary | ICD-10-CM | POA: Diagnosis not present

## 2015-06-26 DIAGNOSIS — Z1389 Encounter for screening for other disorder: Secondary | ICD-10-CM | POA: Diagnosis not present

## 2015-07-22 DIAGNOSIS — N400 Enlarged prostate without lower urinary tract symptoms: Secondary | ICD-10-CM | POA: Diagnosis not present

## 2015-07-22 DIAGNOSIS — J101 Influenza due to other identified influenza virus with other respiratory manifestations: Secondary | ICD-10-CM | POA: Diagnosis not present

## 2015-07-22 DIAGNOSIS — M5416 Radiculopathy, lumbar region: Secondary | ICD-10-CM | POA: Diagnosis not present

## 2015-07-31 DIAGNOSIS — M5126 Other intervertebral disc displacement, lumbar region: Secondary | ICD-10-CM | POA: Diagnosis not present

## 2015-07-31 DIAGNOSIS — M5136 Other intervertebral disc degeneration, lumbar region: Secondary | ICD-10-CM | POA: Diagnosis not present

## 2015-08-01 DIAGNOSIS — M5416 Radiculopathy, lumbar region: Secondary | ICD-10-CM | POA: Diagnosis not present

## 2015-08-01 DIAGNOSIS — Z6830 Body mass index (BMI) 30.0-30.9, adult: Secondary | ICD-10-CM | POA: Diagnosis not present

## 2015-08-01 DIAGNOSIS — B370 Candidal stomatitis: Secondary | ICD-10-CM | POA: Diagnosis not present

## 2015-08-01 DIAGNOSIS — M5116 Intervertebral disc disorders with radiculopathy, lumbar region: Secondary | ICD-10-CM | POA: Diagnosis not present

## 2015-08-22 DIAGNOSIS — M5116 Intervertebral disc disorders with radiculopathy, lumbar region: Secondary | ICD-10-CM | POA: Diagnosis not present

## 2015-08-22 DIAGNOSIS — D509 Iron deficiency anemia, unspecified: Secondary | ICD-10-CM | POA: Diagnosis not present

## 2015-08-22 DIAGNOSIS — Z125 Encounter for screening for malignant neoplasm of prostate: Secondary | ICD-10-CM | POA: Diagnosis not present

## 2015-08-22 DIAGNOSIS — N400 Enlarged prostate without lower urinary tract symptoms: Secondary | ICD-10-CM | POA: Diagnosis not present

## 2015-08-22 DIAGNOSIS — I10 Essential (primary) hypertension: Secondary | ICD-10-CM | POA: Diagnosis not present

## 2015-11-07 ENCOUNTER — Encounter: Payer: Self-pay | Admitting: Cardiovascular Disease

## 2015-11-07 ENCOUNTER — Ambulatory Visit (INDEPENDENT_AMBULATORY_CARE_PROVIDER_SITE_OTHER): Payer: Medicare Other | Admitting: Cardiovascular Disease

## 2015-11-07 VITALS — BP 112/88 | HR 57 | Ht 66.0 in | Wt 185.8 lb

## 2015-11-07 DIAGNOSIS — I10 Essential (primary) hypertension: Secondary | ICD-10-CM

## 2015-11-07 DIAGNOSIS — Z789 Other specified health status: Secondary | ICD-10-CM

## 2015-11-07 DIAGNOSIS — G4733 Obstructive sleep apnea (adult) (pediatric): Secondary | ICD-10-CM | POA: Diagnosis not present

## 2015-11-07 DIAGNOSIS — Z711 Person with feared health complaint in whom no diagnosis is made: Secondary | ICD-10-CM | POA: Diagnosis not present

## 2015-11-07 NOTE — Patient Instructions (Signed)
Dr Croitoru recommends that you continue on your current medications as directed. Please refer to the Current Medication list given to you today.  Dr Croitoru recommends that you schedule a follow-up appointment in 1 year. You will receive a reminder letter in the mail two months in advance. If you don't receive a letter, please call our office to schedule the follow-up appointment.  If you need a refill on your cardiac medications before your next appointment, please call your pharmacy. 

## 2015-11-07 NOTE — Progress Notes (Signed)
Cardiology Office Note    Date:  11/11/2015   ID:  Seth Gray, DOB Apr 20, 1936, MRN [id number]  PCP:  Fae Pippin  Cardiologist:   Sanda Klein, MD   Chief Complaint  Patient presents with  . Follow-up    History of Present Illness:  Seth Gray is a 80 y.o. male with essential hypertension and a strong family history of coronary artery disease, but without any personal history of significant coronary events. He had a coronary echogram in 2001 which was normal (performed for a false positive ECG stress test). He continues to work in his garden, although he is upset with a poor progress of his corn plans due to the heavy rains. He remains asymptomatic, although he complains about not being as strong as he was before.  He denies exertional chest and her dyspnea, denies significant edema, intermittent claudication, focal neurological events, syncope or palpitations. He is compliant with CPAP. He continues to follow up with a hematologist for mild stable leukopenia that does not require treatment.  Past Medical History  Diagnosis Date  . Hypertension   . Sleep apnea   . ALLERGIC RHINITIS   . GERD (gastroesophageal reflux disease)   . Leukopenia 03/20/2012    Past Surgical History  Procedure Laterality Date  . Dupyetren's contracture    . Salivary gland extraction      Current Medications: Outpatient Prescriptions Prior to Visit  Medication Sig Dispense Refill  . aspirin 81 MG tablet Take 81 mg by mouth daily.    . hydrochlorothiazide (HYDRODIURIL) 25 MG tablet Take 1/2 tablet by mouth daily    . NITROSTAT 0.4 MG SL tablet Place 0.4 mg under the tongue every 5 (five) minutes as needed.     Marland Kitchen omeprazole (PRILOSEC) 20 MG capsule TAKE ONE CAPSULE BY MOUTH TWICE A DAY 60 capsule 8  . ferrous sulfate 325 (65 FE) MG tablet Take 325 mg by mouth daily with breakfast.    . omeprazole (PRILOSEC) 20 MG capsule Take 20 mg by mouth daily.     No facility-administered  medications prior to visit.     Allergies:   Amoxicillin and Sulfonamide derivatives   Social History   Social History  . Marital Status: Married    Spouse Name: N/A  . Number of Children: N/A  . Years of Education: N/A   Social History Main Topics  . Smoking status: Never Smoker   . Smokeless tobacco: None  . Alcohol Use: No  . Drug Use: No  . Sexual Activity: Not Currently   Other Topics Concern  . None   Social History Narrative     Family History:  The patient's family history includes Arthritis in his mother; Heart disease in his father; Heart failure in his mother.   ROS:   Please see the history of present illness.    ROS All other systems reviewed and are negative.   PHYSICAL EXAM:   VS:  BP 112/88 mmHg  Pulse 57  Ht 5\' 6"  (1.676 m)  Wt 84.278 kg (185 lb 12.8 oz)  BMI 30.00 kg/m2   GEN: Well nourished, well developed, in no acute distress HEENT: normal Neck: no JVD, carotid bruits, or masses Cardiac: RRR; no murmurs, rubs, or gallops,no edema  Respiratory:  clear to auscultation bilaterally, normal work of breathing GI: soft, nontender, nondistended, + BS MS: no deformity or atrophy Skin: warm and dry, no rash Neuro:  Alert and Oriented x 3, Strength and sensation are intact Psych:  euthymic mood, full affect  Wt Readings from Last 3 Encounters:  11/07/15 84.278 kg (185 lb 12.8 oz)  10/14/14 86.047 kg (189 lb 11.2 oz)  03/22/14 85.276 kg (188 lb)      Studies/Labs Reviewed:   EKG:  EKG is ordered today.  The ekg ordered today demonstrates Mild sinus bradycardia, otherwise normal. QTC 426 ms  Recent Labs: Performed by PCP not currently available for review   ASSESSMENT:    1. Essential hypertension   2. Obstructive sleep apnea   3. False positive cardiac stress test      PLAN:  In order of problems listed above:  1. Excellent control 2. Compliant with therapy 3. If he requires future evaluation for possible coronary artery disease,  recommend proceeding directly to coronary angiography or at least a cardiac imaging study.    Medication Adjustments/Labs and Tests Ordered: Current medicines are reviewed at length with the patient today.  Concerns regarding medicines are outlined above.  Medication changes, Labs and Tests ordered today are listed in the Patient Instructions below. Patient Instructions  Dr Sallyanne Kuster recommends that you continue on your current medications as directed. Please refer to the Current Medication list given to you today.  Dr Sallyanne Kuster recommends that you schedule a follow-up appointment in 1 year. You will receive a reminder letter in the mail two months in advance. If you don't receive a letter, please call our office to schedule the follow-up appointment.  If you need a refill on your cardiac medications before your next appointment, please call your pharmacy.     Signed, Sanda Klein, MD  11/11/2015 4:22 PM    [facility] [place], [place], [place]  [address] Phone: [phone]; Fax: [phone]

## 2015-11-11 ENCOUNTER — Other Ambulatory Visit: Payer: Self-pay | Admitting: Cardiovascular Disease

## 2015-11-11 DIAGNOSIS — Z789 Other specified health status: Secondary | ICD-10-CM | POA: Insufficient documentation

## 2015-11-24 DIAGNOSIS — M7701 Medial epicondylitis, right elbow: Secondary | ICD-10-CM | POA: Diagnosis not present

## 2015-11-24 DIAGNOSIS — Z6830 Body mass index (BMI) 30.0-30.9, adult: Secondary | ICD-10-CM | POA: Diagnosis not present

## 2016-01-16 DIAGNOSIS — L578 Other skin changes due to chronic exposure to nonionizing radiation: Secondary | ICD-10-CM | POA: Diagnosis not present

## 2016-01-16 DIAGNOSIS — L300 Nummular dermatitis: Secondary | ICD-10-CM | POA: Diagnosis not present

## 2016-01-16 DIAGNOSIS — L821 Other seborrheic keratosis: Secondary | ICD-10-CM | POA: Diagnosis not present

## 2016-01-16 DIAGNOSIS — L570 Actinic keratosis: Secondary | ICD-10-CM | POA: Diagnosis not present

## 2016-01-16 DIAGNOSIS — L918 Other hypertrophic disorders of the skin: Secondary | ICD-10-CM | POA: Diagnosis not present

## 2016-03-03 DIAGNOSIS — D509 Iron deficiency anemia, unspecified: Secondary | ICD-10-CM | POA: Diagnosis not present

## 2016-03-03 DIAGNOSIS — E781 Pure hyperglyceridemia: Secondary | ICD-10-CM | POA: Diagnosis not present

## 2016-03-03 DIAGNOSIS — Z6830 Body mass index (BMI) 30.0-30.9, adult: Secondary | ICD-10-CM | POA: Diagnosis not present

## 2016-03-03 DIAGNOSIS — N400 Enlarged prostate without lower urinary tract symptoms: Secondary | ICD-10-CM | POA: Diagnosis not present

## 2016-03-03 DIAGNOSIS — Z0000 Encounter for general adult medical examination without abnormal findings: Secondary | ICD-10-CM | POA: Diagnosis not present

## 2016-03-03 DIAGNOSIS — Z23 Encounter for immunization: Secondary | ICD-10-CM | POA: Diagnosis not present

## 2016-03-03 DIAGNOSIS — K219 Gastro-esophageal reflux disease without esophagitis: Secondary | ICD-10-CM | POA: Diagnosis not present

## 2016-03-03 DIAGNOSIS — I10 Essential (primary) hypertension: Secondary | ICD-10-CM | POA: Diagnosis not present

## 2016-05-13 DIAGNOSIS — K209 Esophagitis, unspecified: Secondary | ICD-10-CM | POA: Diagnosis not present

## 2016-05-13 DIAGNOSIS — K219 Gastro-esophageal reflux disease without esophagitis: Secondary | ICD-10-CM | POA: Diagnosis not present

## 2016-05-13 DIAGNOSIS — Z6829 Body mass index (BMI) 29.0-29.9, adult: Secondary | ICD-10-CM | POA: Diagnosis not present

## 2016-06-03 DIAGNOSIS — H5203 Hypermetropia, bilateral: Secondary | ICD-10-CM | POA: Diagnosis not present

## 2016-06-03 DIAGNOSIS — H04123 Dry eye syndrome of bilateral lacrimal glands: Secondary | ICD-10-CM | POA: Diagnosis not present

## 2016-06-03 DIAGNOSIS — H524 Presbyopia: Secondary | ICD-10-CM | POA: Diagnosis not present

## 2016-06-03 DIAGNOSIS — H52223 Regular astigmatism, bilateral: Secondary | ICD-10-CM | POA: Diagnosis not present

## 2016-07-07 ENCOUNTER — Other Ambulatory Visit: Payer: Self-pay | Admitting: Cardiovascular Disease

## 2016-07-07 NOTE — Telephone Encounter (Signed)
Rx(s) sent to pharmacy electronically.  

## 2016-08-31 DIAGNOSIS — I10 Essential (primary) hypertension: Secondary | ICD-10-CM | POA: Diagnosis not present

## 2016-08-31 DIAGNOSIS — E669 Obesity, unspecified: Secondary | ICD-10-CM | POA: Diagnosis not present

## 2016-08-31 DIAGNOSIS — K219 Gastro-esophageal reflux disease without esophagitis: Secondary | ICD-10-CM | POA: Diagnosis not present

## 2016-08-31 DIAGNOSIS — Z1389 Encounter for screening for other disorder: Secondary | ICD-10-CM | POA: Diagnosis not present

## 2016-08-31 DIAGNOSIS — Z9181 History of falling: Secondary | ICD-10-CM | POA: Diagnosis not present

## 2016-08-31 DIAGNOSIS — Z6830 Body mass index (BMI) 30.0-30.9, adult: Secondary | ICD-10-CM | POA: Diagnosis not present

## 2016-08-31 DIAGNOSIS — Z125 Encounter for screening for malignant neoplasm of prostate: Secondary | ICD-10-CM | POA: Diagnosis not present

## 2016-08-31 DIAGNOSIS — N400 Enlarged prostate without lower urinary tract symptoms: Secondary | ICD-10-CM | POA: Diagnosis not present

## 2016-11-10 DIAGNOSIS — Z6830 Body mass index (BMI) 30.0-30.9, adult: Secondary | ICD-10-CM | POA: Diagnosis not present

## 2016-11-10 DIAGNOSIS — M25561 Pain in right knee: Secondary | ICD-10-CM | POA: Diagnosis not present

## 2016-11-10 DIAGNOSIS — E669 Obesity, unspecified: Secondary | ICD-10-CM | POA: Diagnosis not present

## 2016-11-24 DIAGNOSIS — M25561 Pain in right knee: Secondary | ICD-10-CM | POA: Diagnosis not present

## 2016-11-24 DIAGNOSIS — Z6830 Body mass index (BMI) 30.0-30.9, adult: Secondary | ICD-10-CM | POA: Diagnosis not present

## 2016-11-24 DIAGNOSIS — M25551 Pain in right hip: Secondary | ICD-10-CM | POA: Diagnosis not present

## 2016-12-19 ENCOUNTER — Other Ambulatory Visit: Payer: Self-pay | Admitting: Cardiovascular Disease

## 2016-12-20 NOTE — Telephone Encounter (Signed)
Rx(s) sent to pharmacy electronically.  

## 2017-01-07 ENCOUNTER — Encounter: Payer: Self-pay | Admitting: Cardiovascular Disease

## 2017-01-07 ENCOUNTER — Ambulatory Visit (INDEPENDENT_AMBULATORY_CARE_PROVIDER_SITE_OTHER): Payer: Medicare Other | Admitting: Cardiovascular Disease

## 2017-01-07 VITALS — BP 118/78 | HR 64 | Ht 66.0 in | Wt 192.0 lb

## 2017-01-07 DIAGNOSIS — I10 Essential (primary) hypertension: Secondary | ICD-10-CM

## 2017-01-07 DIAGNOSIS — G4733 Obstructive sleep apnea (adult) (pediatric): Secondary | ICD-10-CM

## 2017-01-07 NOTE — Progress Notes (Signed)
Cardiology Office Note    Date:  01/09/2017   ID:  Seth Gray, DOB [birth year], MRN [id number]  PCP:  Cyndi Bender, PA-C  Cardiologist:   Sanda Klein, MD   Chief Complaint  Patient presents with  . Follow-up    DOE    History of Present Illness:  Seth Gray is a 81 y.o. male with essential hypertension and a strong family history of coronary artery disease, but without any personal history of significant coronary events. He had a coronary angiogram in 2001 which was normal (performed for a false positive ECG stress test).   He remains active and keeps a large garden. He denies issues with chest pain or dyspnea with activity. He has occasional chest pain at rest. He has not taken any nitroglycerin. He denies problems with claudication, leg edema, focal neurological deficits, syncope or palpitations. He has not had any falls.  He is compliant with CPAP. He continues to follow up with a hematologist for mild stable leukopenia that does not require treatment.  Past Medical History:  Diagnosis Date  . ALLERGIC RHINITIS   . GERD (gastroesophageal reflux disease)   . Hypertension   . Leukopenia 03/20/2012  . Sleep apnea     Past Surgical History:  Procedure Laterality Date  . Dupyetren's contracture    . salivary gland extraction      Current Medications: Outpatient Medications Prior to Visit  Medication Sig Dispense Refill  . aspirin 81 MG tablet Take 81 mg by mouth daily.    . hydrochlorothiazide (HYDRODIURIL) 25 MG tablet Take 1/2 tablet by mouth daily    . omeprazole (PRILOSEC) 20 MG capsule TAKE 1 CAPSULE (20 MG TOTAL) BY MOUTH 2 (TWO) TIMES DAILY. (Patient taking differently: Take 20 mg by mouth daily. ) 60 capsule 6  . NITROSTAT 0.4 MG SL tablet Place 0.4 mg under the tongue every 5 (five) minutes as needed.     . finasteride (PROSCAR) 5 MG tablet Take 1 tablet by mouth daily.  5   No facility-administered medications prior to visit.      Allergies:    Amoxicillin and Sulfonamide derivatives   Social History   Social History  . Marital status: Married    Spouse name: N/A  . Number of children: N/A  . Years of education: N/A   Social History Main Topics  . Smoking status: Never Smoker  . Smokeless tobacco: Never Used  . Alcohol use No  . Drug use: No  . Sexual activity: Not Currently   Other Topics Concern  . None   Social History Narrative  . None     Family History:  The patient's family history includes Arthritis in his mother; Heart disease in his father; Heart failure in his mother.   ROS:   Please see the history of present illness.    ROS All other systems reviewed and are negative.   PHYSICAL EXAM:   VS:  BP 118/78   Pulse 64   Ht 5\' 6"  (1.676 m)   Wt 192 lb (87.1 kg)   BMI 30.99 kg/m    GEN: Well nourished, well developed, in no acute distress  HEENT: normal  Neck: no JVD, carotid bruits, or masses Cardiac: RRR; no murmurs, rubs, or gallops,no edema  Respiratory:  clear to auscultation bilaterally, normal work of breathing GI: soft, nontender, nondistended, + BS MS: no deformity or atrophy  Skin: warm and dry, no rash Neuro:  Alert and Oriented x 3, Strength and  sensation are intact Psych: euthymic mood, full affect  Wt Readings from Last 3 Encounters:  01/07/17 192 lb (87.1 kg)  11/07/15 185 lb 12.8 oz (84.3 kg)  10/14/14 189 lb 11.2 oz (86 kg)      Studies/Labs Reviewed:   EKG:  EKG is ordered today.  The ekg ordered today demonstrates Normal sinus rhythm, normal tracing. QTC 422 ms  Recent Labs: Performed by PCP not currently available for review. Reported as normal   ASSESSMENT:    1. Essential hypertension   2. Obstructive sleep apnea      PLAN:  In order of problems listed above:  1. HTN: good control 2. OSA: Reports compliance with therapy     Medication Adjustments/Labs and Tests Ordered: Current medicines are reviewed at length with the patient today.  Concerns  regarding medicines are outlined above.  Medication changes, Labs and Tests ordered today are listed in the Patient Instructions below. Patient Instructions  Dr Sallyanne Kuster recommends that you schedule a follow-up appointment in 12 months. You will receive a reminder letter in the mail two months in advance. If you don't receive a letter, please call our office to schedule the follow-up appointment.  If you need a refill on your cardiac medications before your next appointment, please call your pharmacy.    Signed, Sanda Klein, MD  01/09/2017 11:48 AM    [facility] [place], [place], [place]  [address] Phone: [phone]; Fax: [phone]

## 2017-01-07 NOTE — Patient Instructions (Signed)
Dr Croitoru recommends that you schedule a follow-up appointment in 12 months. You will receive a reminder letter in the mail two months in advance. If you don't receive a letter, please call our office to schedule the follow-up appointment.  If you need a refill on your cardiac medications before your next appointment, please call your pharmacy. 

## 2017-02-02 DIAGNOSIS — L578 Other skin changes due to chronic exposure to nonionizing radiation: Secondary | ICD-10-CM | POA: Diagnosis not present

## 2017-02-02 DIAGNOSIS — L821 Other seborrheic keratosis: Secondary | ICD-10-CM | POA: Diagnosis not present

## 2017-02-02 DIAGNOSIS — L570 Actinic keratosis: Secondary | ICD-10-CM | POA: Diagnosis not present

## 2017-02-06 DIAGNOSIS — Z23 Encounter for immunization: Secondary | ICD-10-CM | POA: Diagnosis not present

## 2017-03-04 DIAGNOSIS — Z136 Encounter for screening for cardiovascular disorders: Secondary | ICD-10-CM | POA: Diagnosis not present

## 2017-03-04 DIAGNOSIS — E785 Hyperlipidemia, unspecified: Secondary | ICD-10-CM | POA: Diagnosis not present

## 2017-03-04 DIAGNOSIS — Z0000 Encounter for general adult medical examination without abnormal findings: Secondary | ICD-10-CM | POA: Diagnosis not present

## 2017-03-04 DIAGNOSIS — Z6831 Body mass index (BMI) 31.0-31.9, adult: Secondary | ICD-10-CM | POA: Diagnosis not present

## 2017-03-04 DIAGNOSIS — Z139 Encounter for screening, unspecified: Secondary | ICD-10-CM | POA: Diagnosis not present

## 2017-03-04 DIAGNOSIS — Z125 Encounter for screening for malignant neoplasm of prostate: Secondary | ICD-10-CM | POA: Diagnosis not present

## 2017-03-04 DIAGNOSIS — Z23 Encounter for immunization: Secondary | ICD-10-CM | POA: Diagnosis not present

## 2017-03-04 DIAGNOSIS — E669 Obesity, unspecified: Secondary | ICD-10-CM | POA: Diagnosis not present

## 2017-03-15 DIAGNOSIS — Z79899 Other long term (current) drug therapy: Secondary | ICD-10-CM | POA: Diagnosis not present

## 2017-03-15 DIAGNOSIS — D509 Iron deficiency anemia, unspecified: Secondary | ICD-10-CM | POA: Diagnosis not present

## 2017-03-15 DIAGNOSIS — G4733 Obstructive sleep apnea (adult) (pediatric): Secondary | ICD-10-CM | POA: Diagnosis not present

## 2017-03-15 DIAGNOSIS — E785 Hyperlipidemia, unspecified: Secondary | ICD-10-CM | POA: Diagnosis not present

## 2017-03-15 DIAGNOSIS — K219 Gastro-esophageal reflux disease without esophagitis: Secondary | ICD-10-CM | POA: Diagnosis not present

## 2017-03-15 DIAGNOSIS — I10 Essential (primary) hypertension: Secondary | ICD-10-CM | POA: Diagnosis not present

## 2017-03-15 DIAGNOSIS — Z6830 Body mass index (BMI) 30.0-30.9, adult: Secondary | ICD-10-CM | POA: Diagnosis not present

## 2017-05-11 DIAGNOSIS — L821 Other seborrheic keratosis: Secondary | ICD-10-CM | POA: Diagnosis not present

## 2017-07-05 DIAGNOSIS — H524 Presbyopia: Secondary | ICD-10-CM | POA: Diagnosis not present

## 2017-07-05 DIAGNOSIS — H52223 Regular astigmatism, bilateral: Secondary | ICD-10-CM | POA: Diagnosis not present

## 2017-07-05 DIAGNOSIS — H5211 Myopia, right eye: Secondary | ICD-10-CM | POA: Diagnosis not present

## 2017-07-05 DIAGNOSIS — H5202 Hypermetropia, left eye: Secondary | ICD-10-CM | POA: Diagnosis not present

## 2017-07-05 DIAGNOSIS — H04123 Dry eye syndrome of bilateral lacrimal glands: Secondary | ICD-10-CM | POA: Diagnosis not present

## 2017-09-13 DIAGNOSIS — K219 Gastro-esophageal reflux disease without esophagitis: Secondary | ICD-10-CM | POA: Diagnosis not present

## 2017-09-13 DIAGNOSIS — E781 Pure hyperglyceridemia: Secondary | ICD-10-CM | POA: Diagnosis not present

## 2017-09-13 DIAGNOSIS — I10 Essential (primary) hypertension: Secondary | ICD-10-CM | POA: Diagnosis not present

## 2017-09-13 DIAGNOSIS — Z9181 History of falling: Secondary | ICD-10-CM | POA: Diagnosis not present

## 2017-09-13 DIAGNOSIS — Z1331 Encounter for screening for depression: Secondary | ICD-10-CM | POA: Diagnosis not present

## 2017-09-13 DIAGNOSIS — R21 Rash and other nonspecific skin eruption: Secondary | ICD-10-CM | POA: Diagnosis not present

## 2017-09-13 DIAGNOSIS — D509 Iron deficiency anemia, unspecified: Secondary | ICD-10-CM | POA: Diagnosis not present

## 2017-09-13 DIAGNOSIS — G4733 Obstructive sleep apnea (adult) (pediatric): Secondary | ICD-10-CM | POA: Diagnosis not present

## 2017-11-02 DIAGNOSIS — L821 Other seborrheic keratosis: Secondary | ICD-10-CM | POA: Diagnosis not present

## 2017-11-02 DIAGNOSIS — L570 Actinic keratosis: Secondary | ICD-10-CM | POA: Diagnosis not present

## 2017-11-02 DIAGNOSIS — L578 Other skin changes due to chronic exposure to nonionizing radiation: Secondary | ICD-10-CM | POA: Diagnosis not present

## 2018-01-31 DIAGNOSIS — Z23 Encounter for immunization: Secondary | ICD-10-CM | POA: Diagnosis not present

## 2018-02-01 ENCOUNTER — Other Ambulatory Visit: Payer: Self-pay | Admitting: Cardiovascular Disease

## 2018-03-09 DIAGNOSIS — E781 Pure hyperglyceridemia: Secondary | ICD-10-CM | POA: Diagnosis not present

## 2018-03-09 DIAGNOSIS — Z1339 Encounter for screening examination for other mental health and behavioral disorders: Secondary | ICD-10-CM | POA: Diagnosis not present

## 2018-03-09 DIAGNOSIS — D509 Iron deficiency anemia, unspecified: Secondary | ICD-10-CM | POA: Diagnosis not present

## 2018-03-09 DIAGNOSIS — K219 Gastro-esophageal reflux disease without esophagitis: Secondary | ICD-10-CM | POA: Diagnosis not present

## 2018-03-09 DIAGNOSIS — G4733 Obstructive sleep apnea (adult) (pediatric): Secondary | ICD-10-CM | POA: Diagnosis not present

## 2018-03-09 DIAGNOSIS — Z79899 Other long term (current) drug therapy: Secondary | ICD-10-CM | POA: Diagnosis not present

## 2018-03-09 DIAGNOSIS — I10 Essential (primary) hypertension: Secondary | ICD-10-CM | POA: Diagnosis not present

## 2018-04-24 DIAGNOSIS — E669 Obesity, unspecified: Secondary | ICD-10-CM | POA: Diagnosis not present

## 2018-04-24 DIAGNOSIS — Z0000 Encounter for general adult medical examination without abnormal findings: Secondary | ICD-10-CM | POA: Diagnosis not present

## 2018-04-24 DIAGNOSIS — E785 Hyperlipidemia, unspecified: Secondary | ICD-10-CM | POA: Diagnosis not present

## 2018-04-24 DIAGNOSIS — Z125 Encounter for screening for malignant neoplasm of prostate: Secondary | ICD-10-CM | POA: Diagnosis not present

## 2018-04-24 DIAGNOSIS — Z139 Encounter for screening, unspecified: Secondary | ICD-10-CM | POA: Diagnosis not present

## 2018-04-24 DIAGNOSIS — Z136 Encounter for screening for cardiovascular disorders: Secondary | ICD-10-CM | POA: Diagnosis not present

## 2018-04-24 DIAGNOSIS — Z6831 Body mass index (BMI) 31.0-31.9, adult: Secondary | ICD-10-CM | POA: Diagnosis not present

## 2018-05-05 DIAGNOSIS — N400 Enlarged prostate without lower urinary tract symptoms: Secondary | ICD-10-CM | POA: Diagnosis not present

## 2018-05-05 DIAGNOSIS — N410 Acute prostatitis: Secondary | ICD-10-CM | POA: Diagnosis not present

## 2018-05-08 DIAGNOSIS — N410 Acute prostatitis: Secondary | ICD-10-CM | POA: Diagnosis not present

## 2018-05-08 DIAGNOSIS — R300 Dysuria: Secondary | ICD-10-CM | POA: Diagnosis not present

## 2018-05-11 DIAGNOSIS — N410 Acute prostatitis: Secondary | ICD-10-CM | POA: Diagnosis not present

## 2018-06-05 DIAGNOSIS — N410 Acute prostatitis: Secondary | ICD-10-CM | POA: Diagnosis not present

## 2018-09-13 DIAGNOSIS — N400 Enlarged prostate without lower urinary tract symptoms: Secondary | ICD-10-CM | POA: Diagnosis not present

## 2018-09-13 DIAGNOSIS — I10 Essential (primary) hypertension: Secondary | ICD-10-CM | POA: Diagnosis not present

## 2018-09-13 DIAGNOSIS — D509 Iron deficiency anemia, unspecified: Secondary | ICD-10-CM | POA: Diagnosis not present

## 2018-09-13 DIAGNOSIS — E781 Pure hyperglyceridemia: Secondary | ICD-10-CM | POA: Diagnosis not present

## 2018-10-09 DIAGNOSIS — H5211 Myopia, right eye: Secondary | ICD-10-CM | POA: Diagnosis not present

## 2018-10-09 DIAGNOSIS — H524 Presbyopia: Secondary | ICD-10-CM | POA: Diagnosis not present

## 2018-10-09 DIAGNOSIS — H04123 Dry eye syndrome of bilateral lacrimal glands: Secondary | ICD-10-CM | POA: Diagnosis not present

## 2018-10-09 DIAGNOSIS — H5202 Hypermetropia, left eye: Secondary | ICD-10-CM | POA: Diagnosis not present

## 2018-10-09 DIAGNOSIS — H26493 Other secondary cataract, bilateral: Secondary | ICD-10-CM | POA: Diagnosis not present

## 2018-10-09 DIAGNOSIS — Z961 Presence of intraocular lens: Secondary | ICD-10-CM | POA: Diagnosis not present

## 2018-10-09 DIAGNOSIS — H52223 Regular astigmatism, bilateral: Secondary | ICD-10-CM | POA: Diagnosis not present

## 2018-11-06 DIAGNOSIS — L570 Actinic keratosis: Secondary | ICD-10-CM | POA: Diagnosis not present

## 2018-11-06 DIAGNOSIS — L578 Other skin changes due to chronic exposure to nonionizing radiation: Secondary | ICD-10-CM | POA: Diagnosis not present

## 2018-11-06 DIAGNOSIS — L821 Other seborrheic keratosis: Secondary | ICD-10-CM | POA: Diagnosis not present

## 2018-12-06 DIAGNOSIS — R350 Frequency of micturition: Secondary | ICD-10-CM | POA: Diagnosis not present

## 2018-12-06 DIAGNOSIS — N5201 Erectile dysfunction due to arterial insufficiency: Secondary | ICD-10-CM | POA: Diagnosis not present

## 2018-12-06 DIAGNOSIS — N401 Enlarged prostate with lower urinary tract symptoms: Secondary | ICD-10-CM | POA: Diagnosis not present

## 2019-01-03 DIAGNOSIS — Z6821 Body mass index (BMI) 21.0-21.9, adult: Secondary | ICD-10-CM | POA: Diagnosis not present

## 2019-01-03 DIAGNOSIS — N400 Enlarged prostate without lower urinary tract symptoms: Secondary | ICD-10-CM | POA: Diagnosis not present

## 2019-01-03 DIAGNOSIS — Z23 Encounter for immunization: Secondary | ICD-10-CM | POA: Diagnosis not present

## 2019-01-03 DIAGNOSIS — M25511 Pain in right shoulder: Secondary | ICD-10-CM | POA: Diagnosis not present

## 2019-01-03 DIAGNOSIS — N529 Male erectile dysfunction, unspecified: Secondary | ICD-10-CM | POA: Diagnosis not present

## 2019-03-07 DIAGNOSIS — R350 Frequency of micturition: Secondary | ICD-10-CM | POA: Diagnosis not present

## 2019-03-07 DIAGNOSIS — N401 Enlarged prostate with lower urinary tract symptoms: Secondary | ICD-10-CM | POA: Diagnosis not present

## 2019-03-07 DIAGNOSIS — N5201 Erectile dysfunction due to arterial insufficiency: Secondary | ICD-10-CM | POA: Diagnosis not present

## 2019-03-16 DIAGNOSIS — Z9181 History of falling: Secondary | ICD-10-CM | POA: Diagnosis not present

## 2019-03-16 DIAGNOSIS — N400 Enlarged prostate without lower urinary tract symptoms: Secondary | ICD-10-CM | POA: Diagnosis not present

## 2019-03-16 DIAGNOSIS — Z1331 Encounter for screening for depression: Secondary | ICD-10-CM | POA: Diagnosis not present

## 2019-03-16 DIAGNOSIS — I10 Essential (primary) hypertension: Secondary | ICD-10-CM | POA: Diagnosis not present

## 2019-03-16 DIAGNOSIS — D509 Iron deficiency anemia, unspecified: Secondary | ICD-10-CM | POA: Diagnosis not present

## 2019-03-16 DIAGNOSIS — E781 Pure hyperglyceridemia: Secondary | ICD-10-CM | POA: Diagnosis not present

## 2019-03-22 DIAGNOSIS — D225 Melanocytic nevi of trunk: Secondary | ICD-10-CM | POA: Diagnosis not present

## 2019-03-22 DIAGNOSIS — L578 Other skin changes due to chronic exposure to nonionizing radiation: Secondary | ICD-10-CM | POA: Diagnosis not present

## 2019-03-22 DIAGNOSIS — L821 Other seborrheic keratosis: Secondary | ICD-10-CM | POA: Diagnosis not present

## 2019-03-22 DIAGNOSIS — L570 Actinic keratosis: Secondary | ICD-10-CM | POA: Diagnosis not present

## 2019-03-28 ENCOUNTER — Other Ambulatory Visit: Payer: Self-pay

## 2019-09-17 DIAGNOSIS — Z139 Encounter for screening, unspecified: Secondary | ICD-10-CM | POA: Diagnosis not present

## 2019-09-17 DIAGNOSIS — E781 Pure hyperglyceridemia: Secondary | ICD-10-CM | POA: Diagnosis not present

## 2019-09-17 DIAGNOSIS — N400 Enlarged prostate without lower urinary tract symptoms: Secondary | ICD-10-CM | POA: Diagnosis not present

## 2019-09-17 DIAGNOSIS — D509 Iron deficiency anemia, unspecified: Secondary | ICD-10-CM | POA: Diagnosis not present

## 2019-09-17 DIAGNOSIS — I10 Essential (primary) hypertension: Secondary | ICD-10-CM | POA: Diagnosis not present

## 2019-10-09 DIAGNOSIS — H53002 Unspecified amblyopia, left eye: Secondary | ICD-10-CM | POA: Diagnosis not present

## 2019-10-09 DIAGNOSIS — H5213 Myopia, bilateral: Secondary | ICD-10-CM | POA: Diagnosis not present

## 2019-10-09 DIAGNOSIS — H524 Presbyopia: Secondary | ICD-10-CM | POA: Diagnosis not present

## 2019-10-09 DIAGNOSIS — Z9841 Cataract extraction status, right eye: Secondary | ICD-10-CM | POA: Diagnosis not present

## 2019-10-09 DIAGNOSIS — H52223 Regular astigmatism, bilateral: Secondary | ICD-10-CM | POA: Diagnosis not present

## 2019-10-09 DIAGNOSIS — H26492 Other secondary cataract, left eye: Secondary | ICD-10-CM | POA: Diagnosis not present

## 2019-10-12 DIAGNOSIS — Z6831 Body mass index (BMI) 31.0-31.9, adult: Secondary | ICD-10-CM | POA: Diagnosis not present

## 2019-10-12 DIAGNOSIS — R21 Rash and other nonspecific skin eruption: Secondary | ICD-10-CM | POA: Diagnosis not present

## 2020-01-15 DIAGNOSIS — M25561 Pain in right knee: Secondary | ICD-10-CM | POA: Diagnosis not present

## 2020-01-15 DIAGNOSIS — I872 Venous insufficiency (chronic) (peripheral): Secondary | ICD-10-CM | POA: Diagnosis not present

## 2020-01-15 DIAGNOSIS — R197 Diarrhea, unspecified: Secondary | ICD-10-CM | POA: Diagnosis not present

## 2020-01-15 DIAGNOSIS — Z6829 Body mass index (BMI) 29.0-29.9, adult: Secondary | ICD-10-CM | POA: Diagnosis not present

## 2020-01-15 DIAGNOSIS — R634 Abnormal weight loss: Secondary | ICD-10-CM | POA: Diagnosis not present

## 2020-01-17 DIAGNOSIS — R197 Diarrhea, unspecified: Secondary | ICD-10-CM | POA: Diagnosis not present

## 2020-02-04 DIAGNOSIS — Z23 Encounter for immunization: Secondary | ICD-10-CM | POA: Diagnosis not present

## 2020-02-15 DIAGNOSIS — R634 Abnormal weight loss: Secondary | ICD-10-CM | POA: Diagnosis not present

## 2020-02-15 DIAGNOSIS — R197 Diarrhea, unspecified: Secondary | ICD-10-CM | POA: Diagnosis not present

## 2020-02-15 DIAGNOSIS — Z6829 Body mass index (BMI) 29.0-29.9, adult: Secondary | ICD-10-CM | POA: Diagnosis not present

## 2020-02-15 DIAGNOSIS — I872 Venous insufficiency (chronic) (peripheral): Secondary | ICD-10-CM | POA: Diagnosis not present

## 2020-03-05 DIAGNOSIS — R350 Frequency of micturition: Secondary | ICD-10-CM | POA: Diagnosis not present

## 2020-03-05 DIAGNOSIS — R3912 Poor urinary stream: Secondary | ICD-10-CM | POA: Diagnosis not present

## 2020-03-05 DIAGNOSIS — N5201 Erectile dysfunction due to arterial insufficiency: Secondary | ICD-10-CM | POA: Diagnosis not present

## 2020-03-05 DIAGNOSIS — N401 Enlarged prostate with lower urinary tract symptoms: Secondary | ICD-10-CM | POA: Diagnosis not present

## 2020-03-12 ENCOUNTER — Ambulatory Visit (INDEPENDENT_AMBULATORY_CARE_PROVIDER_SITE_OTHER): Payer: Medicare Other | Admitting: Cardiovascular Disease

## 2020-03-12 ENCOUNTER — Other Ambulatory Visit: Payer: Self-pay

## 2020-03-12 VITALS — BP 118/78 | HR 63

## 2020-03-12 DIAGNOSIS — I10 Essential (primary) hypertension: Secondary | ICD-10-CM | POA: Diagnosis not present

## 2020-03-12 NOTE — Patient Instructions (Signed)
Medication Instructions:  No changes *If you need a refill on your cardiac medications before your next appointment, please call your pharmacy*   Lab Work: None ordered If you have labs (blood work) drawn today and your tests are completely normal, you will receive your results only by: Marland Kitchen MyChart Message (if you have MyChart) OR . A paper copy in the mail If you have any lab test that is abnormal or we need to change your treatment, we will call you to review the results.   Testing/Procedures: None ordered   Follow-Up: At [facility], you and your health needs are our priority.  As part of our continuing mission to provide you with exceptional heart care, we have created designated Provider Care Teams.  These Care Teams include your primary Cardiologist (physician) and Advanced Practice Providers (APPs -  Physician Assistants and Nurse Practitioners) who all work together to provide you with the care you need, when you need it.  We recommend signing up for the patient portal called "MyChart".  Sign up information is provided on this After Visit Summary.  MyChart is used to connect with patients for Virtual Visits (Telemedicine).  Patients are able to view lab/test results, encounter notes, upcoming appointments, etc.  Non-urgent messages can be sent to your provider as well.   To learn more about what you can do with MyChart, go to [email or web address].    Your next appointment:   Follow as needed with Dr. Sallyanne Kuster

## 2020-03-15 NOTE — Progress Notes (Signed)
Cardiology Office Note    Date:  03/15/2020   ID:  Seth Gray, DOB June 30, 1935, MRN [id number]  PCP:  Seth Bender, PA-C  Cardiologist:   Seth Klein, MD   Chief Complaint  Patient presents with  . New Patient (Initial Visit)    12 months.    History of Present Illness:  Seth Gray is a 84 y.o. male with essential hypertension and a strong family history of coronary artery disease, but without any personal history of significant coronary events. He had a coronary angiogram in 2001 which was normal (performed for a false positive ECG stress test).   He continues to be remarkably active for his age and has no complaints with activity. He specifically denies any chest or epigastric discomfort with exercise (he still keeps a very large garden). He has not had problems with dyspnea. He has occasional "indigestion" that always occurs at rest, is associated with eating and resolves spontaneously.  He denies syncope, palpitations, dizziness, leg edema, claudication or focal neurological complaints.  He has a pruritic rash in his left pretibial area and then a small area of his back. Topical steroids have not helped this but when he was briefly prescribed oral steroids the area resolved completely, only to recur after he discontinued the medication. He will be seeing a dermatologist soon.  He remains compliant with CPAP and denies daytime hypersomnolence.  Past Medical History:  Diagnosis Date  . ALLERGIC RHINITIS   . GERD (gastroesophageal reflux disease)   . Hypertension   . Leukopenia 03/20/2012  . Sleep apnea     Past Surgical History:  Procedure Laterality Date  . Dupyetren's contracture    . salivary gland extraction      Current Medications: Outpatient Medications Prior to Visit  Medication Sig Dispense Refill  . aspirin 81 MG tablet Take 81 mg by mouth daily.    . hydrochlorothiazide (HYDRODIURIL) 25 MG tablet Take 1/2 tablet by mouth daily    . omeprazole  (PRILOSEC) 20 MG capsule TAKE 1 CAPSULE (20 MG TOTAL) BY MOUTH 2 (TWO) TIMES DAILY. (Patient taking differently: Take 20 mg by mouth daily. ) 180 capsule 1   No facility-administered medications prior to visit.     Allergies:   Amoxicillin and Sulfonamide derivatives   Social History   Socioeconomic History  . Marital status: Married    Spouse name: Not on file  . Number of children: Not on file  . Years of education: Not on file  . Highest education level: Not on file  Occupational History  . Not on file  Tobacco Use  . Smoking status: Never Smoker  . Smokeless tobacco: Never Used  Substance and Sexual Activity  . Alcohol use: No  . Drug use: No  . Sexual activity: Not Currently  Other Topics Concern  . Not on file  Social History Narrative  . Not on file   Social Determinants of Health   Financial Resource Strain:   . Difficulty of Paying Living Expenses: Not on file  Food Insecurity:   . Worried About Charity fundraiser in the Last Year: Not on file  . Ran Out of Food in the Last Year: Not on file  Transportation Needs:   . Lack of Transportation (Medical): Not on file  . Lack of Transportation (Non-Medical): Not on file  Physical Activity:   . Days of Exercise per Week: Not on file  . Minutes of Exercise per Session: Not on file  Stress:   .  Feeling of Stress : Not on file  Social Connections:   . Frequency of Communication with Friends and Family: Not on file  . Frequency of Social Gatherings with Friends and Family: Not on file  . Attends Religious Services: Not on file  . Active Member of Clubs or Organizations: Not on file  . Attends Archivist Meetings: Not on file  . Marital Status: Not on file     Family History:  The patient's family history includes Arthritis in his mother; Heart disease in his father; Heart failure in his mother.   ROS:   Please see the history of present illness.    ROS All other systems reviewed and are  negative.   PHYSICAL EXAM:   VS:  BP 118/78 (BP Location: Left Arm, Patient Position: Sitting, Cuff Size: Normal)   Pulse 63     General: Alert, oriented x3, no distress, appears well Head: no evidence of trauma, PERRL, EOMI, no exophtalmos or lid lag, no myxedema, no xanthelasma; normal ears, nose and oropharynx Neck: normal jugular venous pulsations and no hepatojugular reflux; brisk carotid pulses without delay and no carotid bruits Chest: clear to auscultation, no signs of consolidation by percussion or palpation, normal fremitus, symmetrical and full respiratory excursions Cardiovascular: normal position and quality of the apical impulse, regular rhythm, normal first and second heart sounds, no murmurs, rubs or gallops Abdomen: no tenderness or distention, no masses by palpation, no abnormal pulsatility or arterial bruits, normal bowel sounds, no hepatosplenomegaly Extremities: no clubbing, cyanosis or edema; 2+ radial, ulnar and brachial pulses bilaterally; 2+ right femoral, posterior tibial and dorsalis pedis pulses; 2+ left femoral, posterior tibial and dorsalis pedis pulses; no subclavian or femoral bruits Neurological: grossly nonfocal Psych: Normal mood and affect   Wt Readings from Last 3 Encounters:  01/07/17 192 lb (87.1 kg)  11/07/15 185 lb 12.8 oz (84.3 kg)  10/14/14 189 lb 11.2 oz (86 kg)      Studies/Labs Reviewed:   EKG:  EKG is ordered today. It shows normal sinus rhythm and is completely normal other than borderline low voltage. No repolarization abnormality are seen. Recent Labs: 09/17/2019 Total cholesterol 172, HDL 40, LDL 103, triglycerides 124  01/15/2020 Hemoglobin 14.8, creatinine 1.18 normal liver function tests, TSH 1.28 ASSESSMENT:    1. Essential hypertension      PLAN:  In order of problems listed above:  1. HTN: Excellent control on low-dose diuretic monotherapy 2. OSA: Reports compliance with therapy and denies daytime  hypersomnolence  Follow-up with cardiology only as needed.   Medication Adjustments/Labs and Tests Ordered: Current medicines are reviewed at length with the patient today.  Concerns regarding medicines are outlined above.  Medication changes, Labs and Tests ordered today are listed in the Patient Instructions below. Patient Instructions  Medication Instructions:  No changes *If you need a refill on your cardiac medications before your next appointment, please call your pharmacy*   Lab Work: None ordered If you have labs (blood work) drawn today and your tests are completely normal, you will receive your results only by: [place] [facility] MyChart Message (if you have MyChart) OR . A paper copy in the mail If you have any lab test that is abnormal or we need to change your treatment, we will call you to review the results.   Testing/Procedures: None ordered   Follow-Up: At [facility], you and your health needs are our priority.  As part of our continuing mission to provide you with exceptional heart care,  we have created designated Provider Care Teams.  These Care Teams include your primary Cardiologist (physician) and Advanced Practice Providers (APPs -  Physician Assistants and Nurse Practitioners) who all work together to provide you with the care you need, when you need it.  We recommend signing up for the patient portal called "MyChart".  Sign up information is provided on this After Visit Summary.  MyChart is used to connect with patients for Virtual Visits (Telemedicine).  Patients are able to view lab/test results, encounter notes, upcoming appointments, etc.  Non-urgent messages can be sent to your provider as well.   To learn more about what you can do with MyChart, go to [email or web address].    Your next appointment:   Follow as needed with Dr. Sallyanne Kuster     Signed, Seth Klein, MD  03/15/2020 9:21 PM    [address], [place], [place]   [address] Phone: [phone]; Fax: [phone]

## 2020-03-20 DIAGNOSIS — I10 Essential (primary) hypertension: Secondary | ICD-10-CM | POA: Diagnosis not present

## 2020-03-20 DIAGNOSIS — N400 Enlarged prostate without lower urinary tract symptoms: Secondary | ICD-10-CM | POA: Diagnosis not present

## 2020-03-20 DIAGNOSIS — E781 Pure hyperglyceridemia: Secondary | ICD-10-CM | POA: Diagnosis not present

## 2020-03-20 DIAGNOSIS — K219 Gastro-esophageal reflux disease without esophagitis: Secondary | ICD-10-CM | POA: Diagnosis not present

## 2020-03-20 DIAGNOSIS — G4733 Obstructive sleep apnea (adult) (pediatric): Secondary | ICD-10-CM | POA: Diagnosis not present

## 2020-03-20 DIAGNOSIS — I872 Venous insufficiency (chronic) (peripheral): Secondary | ICD-10-CM | POA: Diagnosis not present

## 2020-03-20 DIAGNOSIS — Z6829 Body mass index (BMI) 29.0-29.9, adult: Secondary | ICD-10-CM | POA: Diagnosis not present

## 2020-03-20 DIAGNOSIS — D509 Iron deficiency anemia, unspecified: Secondary | ICD-10-CM | POA: Diagnosis not present

## 2020-03-21 DIAGNOSIS — R600 Localized edema: Secondary | ICD-10-CM | POA: Diagnosis not present

## 2020-03-21 DIAGNOSIS — I8310 Varicose veins of unspecified lower extremity with inflammation: Secondary | ICD-10-CM | POA: Diagnosis not present

## 2020-05-01 DIAGNOSIS — Z139 Encounter for screening, unspecified: Secondary | ICD-10-CM | POA: Diagnosis not present

## 2020-05-01 DIAGNOSIS — Z1331 Encounter for screening for depression: Secondary | ICD-10-CM | POA: Diagnosis not present

## 2020-05-01 DIAGNOSIS — Z0000 Encounter for general adult medical examination without abnormal findings: Secondary | ICD-10-CM | POA: Diagnosis not present

## 2020-05-01 DIAGNOSIS — E785 Hyperlipidemia, unspecified: Secondary | ICD-10-CM | POA: Diagnosis not present

## 2020-05-01 DIAGNOSIS — Z9181 History of falling: Secondary | ICD-10-CM | POA: Diagnosis not present

## 2020-06-05 DIAGNOSIS — Z9849 Cataract extraction status, unspecified eye: Secondary | ICD-10-CM | POA: Diagnosis not present

## 2020-06-05 DIAGNOSIS — H5202 Hypermetropia, left eye: Secondary | ICD-10-CM | POA: Diagnosis not present

## 2020-06-05 DIAGNOSIS — H52223 Regular astigmatism, bilateral: Secondary | ICD-10-CM | POA: Diagnosis not present

## 2020-06-05 DIAGNOSIS — H5211 Myopia, right eye: Secondary | ICD-10-CM | POA: Diagnosis not present

## 2020-06-05 DIAGNOSIS — H26492 Other secondary cataract, left eye: Secondary | ICD-10-CM | POA: Diagnosis not present

## 2020-06-05 DIAGNOSIS — Z961 Presence of intraocular lens: Secondary | ICD-10-CM | POA: Diagnosis not present

## 2020-06-05 DIAGNOSIS — H524 Presbyopia: Secondary | ICD-10-CM | POA: Diagnosis not present

## 2020-07-24 DIAGNOSIS — Z961 Presence of intraocular lens: Secondary | ICD-10-CM | POA: Diagnosis not present

## 2020-07-24 DIAGNOSIS — H353131 Nonexudative age-related macular degeneration, bilateral, early dry stage: Secondary | ICD-10-CM | POA: Diagnosis not present

## 2020-07-24 DIAGNOSIS — H26492 Other secondary cataract, left eye: Secondary | ICD-10-CM | POA: Diagnosis not present

## 2020-07-24 DIAGNOSIS — H18413 Arcus senilis, bilateral: Secondary | ICD-10-CM | POA: Diagnosis not present

## 2020-07-31 DIAGNOSIS — Z961 Presence of intraocular lens: Secondary | ICD-10-CM | POA: Diagnosis not present

## 2020-07-31 DIAGNOSIS — H5202 Hypermetropia, left eye: Secondary | ICD-10-CM | POA: Diagnosis not present

## 2020-07-31 DIAGNOSIS — Z9842 Cataract extraction status, left eye: Secondary | ICD-10-CM | POA: Diagnosis not present

## 2020-07-31 DIAGNOSIS — H52222 Regular astigmatism, left eye: Secondary | ICD-10-CM | POA: Diagnosis not present

## 2020-08-11 DIAGNOSIS — Z23 Encounter for immunization: Secondary | ICD-10-CM | POA: Diagnosis not present

## 2020-08-26 ENCOUNTER — Other Ambulatory Visit: Payer: Self-pay | Admitting: Gastroenterology

## 2020-08-26 ENCOUNTER — Ambulatory Visit
Admission: RE | Admit: 2020-08-26 | Discharge: 2020-08-26 | Disposition: A | Discharge status: Home / Self Care | Payer: Medicare Other | Source: Ambulatory Visit | Attending: Gastroenterology | Admitting: Gastroenterology

## 2020-08-26 DIAGNOSIS — R143 Flatulence: Secondary | ICD-10-CM | POA: Diagnosis not present

## 2020-08-26 DIAGNOSIS — K5900 Constipation, unspecified: Secondary | ICD-10-CM | POA: Diagnosis not present

## 2020-08-26 DIAGNOSIS — N39490 Overflow incontinence: Secondary | ICD-10-CM | POA: Diagnosis not present

## 2020-08-26 DIAGNOSIS — R140 Abdominal distension (gaseous): Secondary | ICD-10-CM | POA: Diagnosis not present

## 2020-08-29 DIAGNOSIS — M25561 Pain in right knee: Secondary | ICD-10-CM | POA: Diagnosis not present

## 2020-08-29 DIAGNOSIS — Z6831 Body mass index (BMI) 31.0-31.9, adult: Secondary | ICD-10-CM | POA: Diagnosis not present

## 2020-09-16 DIAGNOSIS — M25561 Pain in right knee: Secondary | ICD-10-CM | POA: Diagnosis not present

## 2020-09-16 DIAGNOSIS — K219 Gastro-esophageal reflux disease without esophagitis: Secondary | ICD-10-CM | POA: Diagnosis not present

## 2020-09-16 DIAGNOSIS — N400 Enlarged prostate without lower urinary tract symptoms: Secondary | ICD-10-CM | POA: Diagnosis not present

## 2020-09-16 DIAGNOSIS — E781 Pure hyperglyceridemia: Secondary | ICD-10-CM | POA: Diagnosis not present

## 2020-09-16 DIAGNOSIS — D509 Iron deficiency anemia, unspecified: Secondary | ICD-10-CM | POA: Diagnosis not present

## 2020-09-16 DIAGNOSIS — Z6832 Body mass index (BMI) 32.0-32.9, adult: Secondary | ICD-10-CM | POA: Diagnosis not present

## 2020-09-16 DIAGNOSIS — G4733 Obstructive sleep apnea (adult) (pediatric): Secondary | ICD-10-CM | POA: Diagnosis not present

## 2020-09-16 DIAGNOSIS — I10 Essential (primary) hypertension: Secondary | ICD-10-CM | POA: Diagnosis not present

## 2020-09-25 DIAGNOSIS — M1712 Unilateral primary osteoarthritis, left knee: Secondary | ICD-10-CM | POA: Diagnosis not present

## 2020-09-25 DIAGNOSIS — M1711 Unilateral primary osteoarthritis, right knee: Secondary | ICD-10-CM | POA: Diagnosis not present

## 2020-10-29 DIAGNOSIS — R143 Flatulence: Secondary | ICD-10-CM | POA: Diagnosis not present

## 2020-10-29 DIAGNOSIS — R197 Diarrhea, unspecified: Secondary | ICD-10-CM | POA: Diagnosis not present

## 2020-10-29 DIAGNOSIS — K5900 Constipation, unspecified: Secondary | ICD-10-CM | POA: Diagnosis not present

## 2020-10-29 DIAGNOSIS — R140 Abdominal distension (gaseous): Secondary | ICD-10-CM | POA: Diagnosis not present

## 2020-11-07 DIAGNOSIS — M1711 Unilateral primary osteoarthritis, right knee: Secondary | ICD-10-CM | POA: Diagnosis not present

## 2020-11-07 DIAGNOSIS — M170 Bilateral primary osteoarthritis of knee: Secondary | ICD-10-CM | POA: Diagnosis not present

## 2020-12-09 DIAGNOSIS — M1711 Unilateral primary osteoarthritis, right knee: Secondary | ICD-10-CM | POA: Diagnosis not present

## 2020-12-16 DIAGNOSIS — M1711 Unilateral primary osteoarthritis, right knee: Secondary | ICD-10-CM | POA: Diagnosis not present

## 2020-12-23 DIAGNOSIS — M1711 Unilateral primary osteoarthritis, right knee: Secondary | ICD-10-CM | POA: Diagnosis not present

## 2020-12-29 DIAGNOSIS — Z6830 Body mass index (BMI) 30.0-30.9, adult: Secondary | ICD-10-CM | POA: Diagnosis not present

## 2020-12-29 DIAGNOSIS — N50812 Left testicular pain: Secondary | ICD-10-CM | POA: Diagnosis not present

## 2021-01-09 DIAGNOSIS — R198 Other specified symptoms and signs involving the digestive system and abdomen: Secondary | ICD-10-CM | POA: Diagnosis not present

## 2021-01-09 DIAGNOSIS — Z6831 Body mass index (BMI) 31.0-31.9, adult: Secondary | ICD-10-CM | POA: Diagnosis not present

## 2021-01-09 DIAGNOSIS — Z23 Encounter for immunization: Secondary | ICD-10-CM | POA: Diagnosis not present

## 2021-01-09 DIAGNOSIS — R159 Full incontinence of feces: Secondary | ICD-10-CM | POA: Diagnosis not present

## 2021-01-09 DIAGNOSIS — N50812 Left testicular pain: Secondary | ICD-10-CM | POA: Diagnosis not present

## 2021-01-24 DIAGNOSIS — Z23 Encounter for immunization: Secondary | ICD-10-CM | POA: Diagnosis not present

## 2021-02-03 DIAGNOSIS — M1711 Unilateral primary osteoarthritis, right knee: Secondary | ICD-10-CM | POA: Diagnosis not present

## 2021-02-12 DIAGNOSIS — H353121 Nonexudative age-related macular degeneration, left eye, early dry stage: Secondary | ICD-10-CM | POA: Diagnosis not present

## 2021-02-12 DIAGNOSIS — H3530 Unspecified macular degeneration: Secondary | ICD-10-CM | POA: Diagnosis not present

## 2021-02-12 DIAGNOSIS — H52222 Regular astigmatism, left eye: Secondary | ICD-10-CM | POA: Diagnosis not present

## 2021-02-12 DIAGNOSIS — H5212 Myopia, left eye: Secondary | ICD-10-CM | POA: Diagnosis not present

## 2021-02-12 DIAGNOSIS — H35372 Puckering of macula, left eye: Secondary | ICD-10-CM | POA: Diagnosis not present

## 2021-02-26 DIAGNOSIS — M25561 Pain in right knee: Secondary | ICD-10-CM | POA: Diagnosis not present

## 2021-02-26 DIAGNOSIS — M1711 Unilateral primary osteoarthritis, right knee: Secondary | ICD-10-CM | POA: Diagnosis not present

## 2021-03-03 DIAGNOSIS — R197 Diarrhea, unspecified: Secondary | ICD-10-CM | POA: Diagnosis not present

## 2021-03-03 DIAGNOSIS — K621 Rectal polyp: Secondary | ICD-10-CM | POA: Diagnosis not present

## 2021-03-03 DIAGNOSIS — R194 Change in bowel habit: Secondary | ICD-10-CM | POA: Diagnosis not present

## 2021-03-03 DIAGNOSIS — K648 Other hemorrhoids: Secondary | ICD-10-CM | POA: Diagnosis not present

## 2021-03-03 DIAGNOSIS — K5900 Constipation, unspecified: Secondary | ICD-10-CM | POA: Diagnosis not present

## 2021-03-05 DIAGNOSIS — M1711 Unilateral primary osteoarthritis, right knee: Secondary | ICD-10-CM | POA: Diagnosis not present

## 2021-03-05 DIAGNOSIS — M25561 Pain in right knee: Secondary | ICD-10-CM | POA: Diagnosis not present

## 2021-03-06 DIAGNOSIS — K621 Rectal polyp: Secondary | ICD-10-CM | POA: Diagnosis not present

## 2021-03-10 DIAGNOSIS — H43813 Vitreous degeneration, bilateral: Secondary | ICD-10-CM | POA: Diagnosis not present

## 2021-03-10 DIAGNOSIS — H35421 Microcystoid degeneration of retina, right eye: Secondary | ICD-10-CM | POA: Diagnosis not present

## 2021-03-10 DIAGNOSIS — H353132 Nonexudative age-related macular degeneration, bilateral, intermediate dry stage: Secondary | ICD-10-CM | POA: Diagnosis not present

## 2021-03-12 DIAGNOSIS — M1711 Unilateral primary osteoarthritis, right knee: Secondary | ICD-10-CM | POA: Diagnosis not present

## 2021-03-12 DIAGNOSIS — M25561 Pain in right knee: Secondary | ICD-10-CM | POA: Diagnosis not present

## 2021-03-19 DIAGNOSIS — M1711 Unilateral primary osteoarthritis, right knee: Secondary | ICD-10-CM | POA: Diagnosis not present

## 2021-03-19 DIAGNOSIS — M25561 Pain in right knee: Secondary | ICD-10-CM | POA: Diagnosis not present

## 2021-03-23 DIAGNOSIS — Z6830 Body mass index (BMI) 30.0-30.9, adult: Secondary | ICD-10-CM | POA: Diagnosis not present

## 2021-03-23 DIAGNOSIS — Z79899 Other long term (current) drug therapy: Secondary | ICD-10-CM | POA: Diagnosis not present

## 2021-03-23 DIAGNOSIS — H524 Presbyopia: Secondary | ICD-10-CM | POA: Diagnosis not present

## 2021-03-23 DIAGNOSIS — N529 Male erectile dysfunction, unspecified: Secondary | ICD-10-CM | POA: Diagnosis not present

## 2021-03-23 DIAGNOSIS — E781 Pure hyperglyceridemia: Secondary | ICD-10-CM | POA: Diagnosis not present

## 2021-03-23 DIAGNOSIS — R413 Other amnesia: Secondary | ICD-10-CM | POA: Diagnosis not present

## 2021-03-23 DIAGNOSIS — H5213 Myopia, bilateral: Secondary | ICD-10-CM | POA: Diagnosis not present

## 2021-03-23 DIAGNOSIS — G4733 Obstructive sleep apnea (adult) (pediatric): Secondary | ICD-10-CM | POA: Diagnosis not present

## 2021-03-23 DIAGNOSIS — K219 Gastro-esophageal reflux disease without esophagitis: Secondary | ICD-10-CM | POA: Diagnosis not present

## 2021-03-23 DIAGNOSIS — N400 Enlarged prostate without lower urinary tract symptoms: Secondary | ICD-10-CM | POA: Diagnosis not present

## 2021-03-23 DIAGNOSIS — H52223 Regular astigmatism, bilateral: Secondary | ICD-10-CM | POA: Diagnosis not present

## 2021-03-23 DIAGNOSIS — D509 Iron deficiency anemia, unspecified: Secondary | ICD-10-CM | POA: Diagnosis not present

## 2021-03-23 DIAGNOSIS — I10 Essential (primary) hypertension: Secondary | ICD-10-CM | POA: Diagnosis not present

## 2021-03-23 DIAGNOSIS — H40052 Ocular hypertension, left eye: Secondary | ICD-10-CM | POA: Diagnosis not present

## 2021-03-25 DIAGNOSIS — M25561 Pain in right knee: Secondary | ICD-10-CM | POA: Diagnosis not present

## 2021-03-25 DIAGNOSIS — M1711 Unilateral primary osteoarthritis, right knee: Secondary | ICD-10-CM | POA: Diagnosis not present

## 2021-03-31 DIAGNOSIS — K5900 Constipation, unspecified: Secondary | ICD-10-CM | POA: Diagnosis not present

## 2021-03-31 DIAGNOSIS — L821 Other seborrheic keratosis: Secondary | ICD-10-CM | POA: Diagnosis not present

## 2021-03-31 DIAGNOSIS — L578 Other skin changes due to chronic exposure to nonionizing radiation: Secondary | ICD-10-CM | POA: Diagnosis not present

## 2021-03-31 DIAGNOSIS — L570 Actinic keratosis: Secondary | ICD-10-CM | POA: Diagnosis not present

## 2021-04-01 DIAGNOSIS — G319 Degenerative disease of nervous system, unspecified: Secondary | ICD-10-CM | POA: Diagnosis not present

## 2021-04-01 DIAGNOSIS — J3489 Other specified disorders of nose and nasal sinuses: Secondary | ICD-10-CM | POA: Diagnosis not present

## 2021-04-01 DIAGNOSIS — R413 Other amnesia: Secondary | ICD-10-CM | POA: Diagnosis not present

## 2021-04-07 DIAGNOSIS — M25551 Pain in right hip: Secondary | ICD-10-CM | POA: Diagnosis not present

## 2021-04-07 DIAGNOSIS — M1711 Unilateral primary osteoarthritis, right knee: Secondary | ICD-10-CM | POA: Diagnosis not present

## 2021-04-21 DIAGNOSIS — N410 Acute prostatitis: Secondary | ICD-10-CM | POA: Diagnosis not present

## 2021-04-21 DIAGNOSIS — R350 Frequency of micturition: Secondary | ICD-10-CM | POA: Diagnosis not present

## 2021-04-21 DIAGNOSIS — N401 Enlarged prostate with lower urinary tract symptoms: Secondary | ICD-10-CM | POA: Diagnosis not present

## 2021-05-03 DIAGNOSIS — R509 Fever, unspecified: Secondary | ICD-10-CM | POA: Diagnosis not present

## 2021-05-14 DIAGNOSIS — Z1331 Encounter for screening for depression: Secondary | ICD-10-CM | POA: Diagnosis not present

## 2021-05-14 DIAGNOSIS — Z0000 Encounter for general adult medical examination without abnormal findings: Secondary | ICD-10-CM | POA: Diagnosis not present

## 2021-05-14 DIAGNOSIS — E785 Hyperlipidemia, unspecified: Secondary | ICD-10-CM | POA: Diagnosis not present

## 2021-05-14 DIAGNOSIS — Z139 Encounter for screening, unspecified: Secondary | ICD-10-CM | POA: Diagnosis not present

## 2021-05-14 DIAGNOSIS — Z9181 History of falling: Secondary | ICD-10-CM | POA: Diagnosis not present

## 2021-05-26 DIAGNOSIS — H40052 Ocular hypertension, left eye: Secondary | ICD-10-CM | POA: Diagnosis not present

## 2021-05-26 DIAGNOSIS — H5202 Hypermetropia, left eye: Secondary | ICD-10-CM | POA: Diagnosis not present

## 2021-05-26 DIAGNOSIS — H52223 Regular astigmatism, bilateral: Secondary | ICD-10-CM | POA: Diagnosis not present

## 2021-05-26 DIAGNOSIS — H5211 Myopia, right eye: Secondary | ICD-10-CM | POA: Diagnosis not present

## 2021-05-26 DIAGNOSIS — H524 Presbyopia: Secondary | ICD-10-CM | POA: Diagnosis not present

## 2021-06-11 DIAGNOSIS — Z20822 Contact with and (suspected) exposure to covid-19: Secondary | ICD-10-CM | POA: Diagnosis not present

## 2021-06-30 DIAGNOSIS — E785 Hyperlipidemia, unspecified: Secondary | ICD-10-CM | POA: Diagnosis not present

## 2021-06-30 DIAGNOSIS — K219 Gastro-esophageal reflux disease without esophagitis: Secondary | ICD-10-CM | POA: Diagnosis not present

## 2021-06-30 DIAGNOSIS — I10 Essential (primary) hypertension: Secondary | ICD-10-CM | POA: Diagnosis not present

## 2021-07-03 DIAGNOSIS — N401 Enlarged prostate with lower urinary tract symptoms: Secondary | ICD-10-CM | POA: Diagnosis not present

## 2021-07-03 DIAGNOSIS — N5201 Erectile dysfunction due to arterial insufficiency: Secondary | ICD-10-CM | POA: Diagnosis not present

## 2021-07-03 DIAGNOSIS — R350 Frequency of micturition: Secondary | ICD-10-CM | POA: Diagnosis not present

## 2021-07-07 DIAGNOSIS — H43813 Vitreous degeneration, bilateral: Secondary | ICD-10-CM | POA: Diagnosis not present

## 2021-07-07 DIAGNOSIS — H35421 Microcystoid degeneration of retina, right eye: Secondary | ICD-10-CM | POA: Diagnosis not present

## 2021-07-07 DIAGNOSIS — H26491 Other secondary cataract, right eye: Secondary | ICD-10-CM | POA: Diagnosis not present

## 2021-07-07 DIAGNOSIS — H353132 Nonexudative age-related macular degeneration, bilateral, intermediate dry stage: Secondary | ICD-10-CM | POA: Diagnosis not present

## 2021-07-26 DIAGNOSIS — Z20822 Contact with and (suspected) exposure to covid-19: Secondary | ICD-10-CM | POA: Diagnosis not present

## 2021-08-13 DIAGNOSIS — H5211 Myopia, right eye: Secondary | ICD-10-CM | POA: Diagnosis not present

## 2021-08-13 DIAGNOSIS — Z961 Presence of intraocular lens: Secondary | ICD-10-CM | POA: Diagnosis not present

## 2021-08-13 DIAGNOSIS — H524 Presbyopia: Secondary | ICD-10-CM | POA: Diagnosis not present

## 2021-08-13 DIAGNOSIS — H52223 Regular astigmatism, bilateral: Secondary | ICD-10-CM | POA: Diagnosis not present

## 2021-08-13 DIAGNOSIS — H26492 Other secondary cataract, left eye: Secondary | ICD-10-CM | POA: Diagnosis not present

## 2021-08-13 DIAGNOSIS — H5202 Hypermetropia, left eye: Secondary | ICD-10-CM | POA: Diagnosis not present

## 2021-08-13 DIAGNOSIS — Z9841 Cataract extraction status, right eye: Secondary | ICD-10-CM | POA: Diagnosis not present

## 2021-09-07 DIAGNOSIS — Z23 Encounter for immunization: Secondary | ICD-10-CM | POA: Diagnosis not present

## 2021-09-07 DIAGNOSIS — Z20822 Contact with and (suspected) exposure to covid-19: Secondary | ICD-10-CM | POA: Diagnosis not present

## 2021-09-24 DIAGNOSIS — E781 Pure hyperglyceridemia: Secondary | ICD-10-CM | POA: Diagnosis not present

## 2021-09-24 DIAGNOSIS — G4733 Obstructive sleep apnea (adult) (pediatric): Secondary | ICD-10-CM | POA: Diagnosis not present

## 2021-09-24 DIAGNOSIS — R55 Syncope and collapse: Secondary | ICD-10-CM | POA: Diagnosis not present

## 2021-09-24 DIAGNOSIS — G3184 Mild cognitive impairment, so stated: Secondary | ICD-10-CM | POA: Diagnosis not present

## 2021-09-24 DIAGNOSIS — G319 Degenerative disease of nervous system, unspecified: Secondary | ICD-10-CM | POA: Diagnosis not present

## 2021-09-24 DIAGNOSIS — N400 Enlarged prostate without lower urinary tract symptoms: Secondary | ICD-10-CM | POA: Diagnosis not present

## 2021-09-24 DIAGNOSIS — K219 Gastro-esophageal reflux disease without esophagitis: Secondary | ICD-10-CM | POA: Diagnosis not present

## 2021-09-24 DIAGNOSIS — I10 Essential (primary) hypertension: Secondary | ICD-10-CM | POA: Diagnosis not present

## 2021-09-24 DIAGNOSIS — D509 Iron deficiency anemia, unspecified: Secondary | ICD-10-CM | POA: Diagnosis not present

## 2021-09-24 DIAGNOSIS — N529 Male erectile dysfunction, unspecified: Secondary | ICD-10-CM | POA: Diagnosis not present

## 2021-12-02 DIAGNOSIS — I10 Essential (primary) hypertension: Secondary | ICD-10-CM | POA: Diagnosis not present

## 2021-12-02 DIAGNOSIS — K219 Gastro-esophageal reflux disease without esophagitis: Secondary | ICD-10-CM | POA: Diagnosis not present

## 2021-12-02 DIAGNOSIS — N400 Enlarged prostate without lower urinary tract symptoms: Secondary | ICD-10-CM | POA: Diagnosis not present

## 2021-12-02 DIAGNOSIS — G319 Degenerative disease of nervous system, unspecified: Secondary | ICD-10-CM | POA: Diagnosis not present

## 2021-12-02 DIAGNOSIS — E781 Pure hyperglyceridemia: Secondary | ICD-10-CM | POA: Diagnosis not present

## 2021-12-02 DIAGNOSIS — G3184 Mild cognitive impairment, so stated: Secondary | ICD-10-CM | POA: Diagnosis not present

## 2021-12-02 DIAGNOSIS — G4733 Obstructive sleep apnea (adult) (pediatric): Secondary | ICD-10-CM | POA: Diagnosis not present

## 2021-12-28 DIAGNOSIS — L570 Actinic keratosis: Secondary | ICD-10-CM | POA: Diagnosis not present

## 2021-12-28 DIAGNOSIS — L578 Other skin changes due to chronic exposure to nonionizing radiation: Secondary | ICD-10-CM | POA: Diagnosis not present

## 2021-12-28 DIAGNOSIS — L821 Other seborrheic keratosis: Secondary | ICD-10-CM | POA: Diagnosis not present

## 2022-01-11 DIAGNOSIS — Z23 Encounter for immunization: Secondary | ICD-10-CM | POA: Diagnosis not present

## 2022-01-12 DIAGNOSIS — H43813 Vitreous degeneration, bilateral: Secondary | ICD-10-CM | POA: Diagnosis not present

## 2022-01-12 DIAGNOSIS — H26491 Other secondary cataract, right eye: Secondary | ICD-10-CM | POA: Diagnosis not present

## 2022-01-12 DIAGNOSIS — H353132 Nonexudative age-related macular degeneration, bilateral, intermediate dry stage: Secondary | ICD-10-CM | POA: Diagnosis not present

## 2022-01-12 DIAGNOSIS — H35421 Microcystoid degeneration of retina, right eye: Secondary | ICD-10-CM | POA: Diagnosis not present

## 2022-01-24 DIAGNOSIS — Z23 Encounter for immunization: Secondary | ICD-10-CM | POA: Diagnosis not present

## 2022-01-28 ENCOUNTER — Encounter: Payer: Self-pay | Admitting: Cardiovascular Disease

## 2022-01-28 ENCOUNTER — Ambulatory Visit: Payer: Medicare Other | Attending: Cardiovascular Disease

## 2022-01-28 ENCOUNTER — Ambulatory Visit: Payer: Medicare Other | Attending: Cardiovascular Disease | Admitting: Cardiovascular Disease

## 2022-01-28 VITALS — BP 122/66 | HR 53 | Ht 60.0 in | Wt 189.0 lb

## 2022-01-28 DIAGNOSIS — G4733 Obstructive sleep apnea (adult) (pediatric): Secondary | ICD-10-CM

## 2022-01-28 DIAGNOSIS — R001 Bradycardia, unspecified: Secondary | ICD-10-CM

## 2022-01-28 DIAGNOSIS — I10 Essential (primary) hypertension: Secondary | ICD-10-CM | POA: Diagnosis not present

## 2022-01-28 DIAGNOSIS — R600 Localized edema: Secondary | ICD-10-CM | POA: Insufficient documentation

## 2022-01-28 NOTE — Progress Notes (Signed)
Cardiology Office Note    Date:  01/28/2022   ID:  THALES KNIPPLE, DOB [birth year], MRN [id number]  PCP:  Cyndi Bender, PA-C  Cardiologist:   Sanda Klein, MD   Chief Complaint  Patient presents with   Follow-up    History of Present Illness:  Seth Gray is a 86 y.o. male with OSA, essential hypertension and a strong family history of coronary artery disease, but without any personal history of significant coronary events. He had a coronary angiogram in 2001 which was normal (performed for a false positive ECG stress test).   He generally been doing quite well from a cardiovascular point of view since his last appointment.  His major complaint is that he is often easily tired.  Despite this he continues to keep a large garden and has no difficulty climbing the stairs in his home up and down several times a day.  His complaint is primarily fatigue, not dyspnea.  He has poor quality sleep, both subjectively and as assessed by his Fitbit sleep monitoring feature.  He has significant daytime hypersomnolence.  He will fall asleep easily when sitting in a chair at the drop of a pin according to his wife.  He often takes a nap in the afternoons and does not use CPAP when he does so.  He sometimes wakes up feeling already tired.  He has been worried about memory problems.  His Fitbit also tracks his heart rate which seems to almost never go over 65 bpm, if the device is accurate.  He denies angina at rest or with activity.  He does have some mild swelling of his feet especially towards the end of the day.  He denies orthopnea, PND, palpitations, dizziness or syncope.  CPAP was prescribed and previously supervised by Dr. Keturah Barre, who has since retired.  He has not seen a different pulmonary provider yet.  Sometimes his mask leaks.  He does not think he has had the device downloaded in "a while".  This is his first appointment since November 2021.  Past Medical History:  Diagnosis Date    ALLERGIC RHINITIS    GERD (gastroesophageal reflux disease)    Hypertension    Leukopenia 03/20/2012   Sleep apnea     Past Surgical History:  Procedure Laterality Date   Dupyetren's contracture     salivary gland extraction      Current Medications: Outpatient Medications Prior to Visit  Medication Sig Dispense Refill   aspirin 81 MG tablet Take 81 mg by mouth daily.     hydrochlorothiazide (HYDRODIURIL) 25 MG tablet Take 1/2 tablet by mouth daily     omeprazole (PRILOSEC) 20 MG capsule TAKE 1 CAPSULE (20 MG TOTAL) BY MOUTH 2 (TWO) TIMES DAILY. (Patient taking differently: Take 20 mg by mouth daily.) 180 capsule 1   tadalafil (CIALIS) 20 MG tablet Take 10-20 mg by mouth daily as needed.     No facility-administered medications prior to visit.     Allergies:   Amoxicillin and Sulfonamide derivatives   Social History   Socioeconomic History   Marital status: Married    Spouse name: Not on file   Number of children: Not on file   Years of education: Not on file   Highest education level: Not on file  Occupational History   Not on file  Tobacco Use   Smoking status: Never   Smokeless tobacco: Never  Substance and Sexual Activity   Alcohol use: No   Drug use:  No   Sexual activity: Not Currently  Other Topics Concern   Not on file  Social History Narrative   Not on file   Social Determinants of Health   Financial Resource Strain: Not on file  Food Insecurity: Not on file  Transportation Needs: Not on file  Physical Activity: Not on file  Stress: Not on file  Social Connections: Not on file     Family History:  The patient's family history includes Arthritis in his mother; Heart disease in his father; Heart failure in his mother.   ROS:   Please see the history of present illness.    ROS All other systems reviewed and are negative.   PHYSICAL EXAM:   VS:  BP 122/66 (BP Location: Left Arm, Patient Position: Sitting, Cuff Size: Large)   Pulse (!) 53   Ht 5'  (1.524 m)   Wt 189 lb (85.7 kg)   SpO2 96%   BMI 36.91 kg/m      General: Alert, oriented x3, no distress, fairly obese.  Appears younger than stated age. Head: no evidence of trauma, PERRL, EOMI, no exophtalmos or lid lag, no myxedema, no xanthelasma; normal ears, nose and oropharynx Neck: normal jugular venous pulsations and no hepatojugular reflux; brisk carotid pulses without delay and no carotid bruits Chest: clear to auscultation, no signs of consolidation by percussion or palpation, normal fremitus, symmetrical and full respiratory excursions Cardiovascular: normal position and quality of the apical impulse, regular rhythm, normal first and second heart sounds, no murmurs, rubs or gallops Abdomen: no tenderness or distention, no masses by palpation, no abnormal pulsatility or arterial bruits, normal bowel sounds, no hepatosplenomegaly Extremities: no clubbing, cyanosis or edema; 2+ radial, ulnar and brachial pulses bilaterally; 2+ right femoral, posterior tibial and dorsalis pedis pulses; 2+ left femoral, posterior tibial and dorsalis pedis pulses; no subclavian or femoral bruits Neurological: grossly nonfocal Psych: Normal mood and affect    Wt Readings from Last 3 Encounters:  01/28/22 189 lb (85.7 kg)  01/07/17 192 lb (87.1 kg)  11/07/15 185 lb 12.8 oz (84.3 kg)      Studies/Labs Reviewed:   EKG:  EKG is ordered today.  Shows sinus bradycardia at 53 bpm but is otherwise normal tracing. Recent Labs: 09/17/2019 Total cholesterol 172, HDL 40, LDL 103, triglycerides 124  01/15/2020 Hemoglobin 14.8, creatinine 1.18 normal liver function tests, TSH 1.28  03/23/2021 TSH 2.18   09/24/2021 Total cholesterol 161, LDL 97, HDL 43, triglycerides 118 Creatinine 1.05, glucose 99, potassium 3.8   ASSESSMENT:    1. Bradycardia   2. Obstructive sleep apnea   3. Essential hypertension   4. Pedal edema      PLAN:  In order of problems listed above:  Bradycardia: Cannot be  attributed to medications.  May have sinus node dysfunction and based on his Fitbit recordings may actually have significant chronotropic incompetence.  We will have a more 72-hour monitor.  He might benefit from rate response based atrial pacing. HTN: Good control on hydrochlorothiazide.  Avoid beta-blockers and other medications with negative chronotropic effect. OSA: He has fatigue and daytime hypersomnolence, significantly changed over the last couple of years.  Also started have some problems with pedal edema, which may be a manifestation of right heart failure.  I wonder if his memory problems may also be related to poor sleep quality.  Advised that he call the pulmonary clinic to set up an appointment and review his current CPAP settings.  Also encouraged him to use a  CPAP even if he takes naps in the afternoon, not just at night.  Check an echocardiogram to assess right ventricular function.     Medication Adjustments/Labs and Tests Ordered: Current medicines are reviewed at length with the patient today.  Concerns regarding medicines are outlined above.  Medication changes, Labs and Tests ordered today are listed in the Patient Instructions below. Patient Instructions  Medication Instructions:  No changes *If you need a refill on your cardiac medications before your next appointment, please call your pharmacy*   Lab Work: None ordered If you have labs (blood work) drawn today and your tests are completely normal, you will receive your results only by: [place] (if you have MyChart) OR A paper copy in the mail If you have any lab test that is abnormal or we need to change your treatment, we will call you to review the results.   Testing/Procedures: Your physician has requested that you have an echocardiogram. Echocardiography is a painless test that uses sound waves to create images of your heart. It provides your doctor with information about the size and shape of your heart  and how well your heart's chambers and valves are working. You may receive an ultrasound enhancing agent through an IV if needed to better visualize your heart during the echo.This procedure takes approximately one hour. There are no restrictions for this procedure. This will take place at the [address].   Contact your pulmonologist to get your CPAP adjusted.   Follow-Up: At [facility], you and your health needs are our priority.  As part of our continuing mission to provide you with exceptional heart care, we have created designated Provider Care Teams.  These Care Teams include your primary Cardiologist (physician) and Advanced Practice Providers (APPs -  Physician Assistants and Nurse Practitioners) who all work together to provide you with the care you need, when you need it.  We recommend signing up for the patient portal called "MyChart".  Sign up information is provided on this After Visit Summary.  MyChart is used to connect with patients for Virtual Visits (Telemedicine).  Patients are able to view lab/test results, encounter notes, upcoming appointments, etc.  Non-urgent messages can be sent to your provider as well.   To learn more about what you can do with MyChart, go to [email or web address].    Your next appointment:   3 month(s)  The format for your next appointment:   In Person  Provider:   Dr. Sallyanne Kuster Other Instructions ZIO XT- Long Term Monitor Instructions  Your physician has requested you wear a ZIO patch monitor for 3 days.  This is a single patch monitor. [employer] supplies one patch monitor per enrollment. Additional stickers are not available. Please do not apply patch if you will be having a Nuclear Stress Test,  Echocardiogram, Cardiac CT, MRI, or Chest Xray during the period you would be wearing the  monitor. The patch cannot be worn during these tests. You cannot remove and re-apply the  ZIO XT patch monitor.  Your ZIO patch  monitor will be mailed 3 day USPS to your address on file. It may take 3-5 days  to receive your monitor after you have been enrolled.  Once you have received your monitor, please review the enclosed instructions. Your monitor  has already been registered assigning a specific monitor serial # to you.  Billing and Patient Assistance Program Information  We have supplied [employer] with any of your insurance information  on file for billing purposes. [employer] offers a sliding scale Patient Assistance Program for patients that do not have  insurance, or whose insurance does not completely cover the cost of the ZIO monitor.  You must apply for the Patient Assistance Program to qualify for this discounted rate.  To apply, please call [employer] at [phone], select option 4, select option 2, ask to apply for  Patient Assistance Program. Seth Gray will ask your household income, and how many people  are in your household. They will quote your out-of-pocket cost based on that information.  [employer] will also be able to set up a 93-month interest-free payment plan if needed.  Applying the monitor   Shave hair from upper left chest.  Hold abrader disc by orange tab. Rub abrader in 40 strokes over the upper left chest as  indicated in your monitor instructions.  Clean area with 4 enclosed alcohol pads. Let dry.  Apply patch as indicated in monitor instructions. Patch will be placed under collarbone on left  side of chest with arrow pointing upward.  Rub patch adhesive wings for 2 minutes. Remove white label marked "1". Remove the white  label marked "2". Rub patch adhesive wings for 2 additional minutes.  While looking in a mirror, press and release button in center of patch. A small green light will  flash 3-4 times. This will be your only indicator that the monitor has been turned on.  Do not shower for the first 24 hours. You may shower after the first 24 hours.  Press the button if you feel a  symptom. You will hear a small click. Record Date, Time and  Symptom in the Patient Logbook.  When you are ready to remove the patch, follow instructions on the last 2 pages of Patient  Logbook. Stick patch monitor onto the last page of Patient Logbook.  Place Patient Logbook in the blue and white box. Use locking tab on box and tape box closed  securely. The blue and white box has prepaid postage on it. Please place it in the mailbox as  soon as possible. Your physician should have your test results approximately 7 days after the  monitor has been [facility]  Call [place] [phone] you have questions regarding  your ZIO XT patch monitor. Call them immediately if you see an orange light blinking on your  monitor.  If your monitor falls off in less than 4 days, contact our Monitor department at [phone]  If your monitor becomes loose or falls off after 4 days call [employer] at [phone]  suggestions on securing your monitor   Important Information About Sugar         Signed, MSanda Klein MD  01/28/2022 1:39 PM    [facility] [address] [place]  [id number]: [phone] Fax: [phone]

## 2022-01-28 NOTE — Progress Notes (Unsigned)
Enrolled for [employer] to mail a ZIO XT long term holter monitor to the patients address on file.  

## 2022-01-28 NOTE — Patient Instructions (Signed)
Medication Instructions:  No changes *If you need a refill on your cardiac medications before your next appointment, please call your pharmacy*   Lab Work: None ordered If you have labs (blood work) drawn today and your tests are completely normal, you will receive your results only by: [place] (if you have MyChart) OR A paper copy in the mail If you have any lab test that is abnormal or we need to change your treatment, we will call you to review the results.   Testing/Procedures: Your physician has requested that you have an echocardiogram. Echocardiography is a painless test that uses sound waves to create images of your heart. It provides your doctor with information about the size and shape of your heart and how well your heart's chambers and valves are working. You may receive an ultrasound enhancing agent through an IV if needed to better visualize your heart during the echo.This procedure takes approximately one hour. There are no restrictions for this procedure. This will take place at the [address].   Contact your pulmonologist to get your CPAP adjusted.   Follow-Up: At [facility], you and your health needs are our priority.  As part of our continuing mission to provide you with exceptional heart care, we have created designated Provider Care Teams.  These Care Teams include your primary Cardiologist (physician) and Advanced Practice Providers (APPs -  Physician Assistants and Nurse Practitioners) who all work together to provide you with the care you need, when you need it.  We recommend signing up for the patient portal called "MyChart".  Sign up information is provided on this After Visit Summary.  MyChart is used to connect with patients for Virtual Visits (Telemedicine).  Patients are able to view lab/test results, encounter notes, upcoming appointments, etc.  Non-urgent messages can be sent to your provider as well.   To learn more about  what you can do with MyChart, go to [email or web address].    Your next appointment:   3 month(s)  The format for your next appointment:   In Person  Provider:   Dr. Sallyanne Kuster Other Instructions ZIO XT- Long Term Monitor Instructions  Your physician has requested you wear a ZIO patch monitor for 3 days.  This is a single patch monitor. [employer] supplies one patch monitor per enrollment. Additional stickers are not available. Please do not apply patch if you will be having a Nuclear Stress Test,  Echocardiogram, Cardiac CT, MRI, or Chest Xray during the period you would be wearing the  monitor. The patch cannot be worn during these tests. You cannot remove and re-apply the  ZIO XT patch monitor.  Your ZIO patch monitor will be mailed 3 day USPS to your address on file. It may take 3-5 days  to receive your monitor after you have been enrolled.  Once you have received your monitor, please review the enclosed instructions. Your monitor  has already been registered assigning a specific monitor serial # to you.  Billing and Patient Assistance Program Information  We have supplied [employer] with any of your insurance information on file for billing purposes. [employer] offers a sliding scale Patient Assistance Program for patients that do not have  insurance, or whose insurance does not completely cover the cost of the ZIO monitor.  You must apply for the Patient Assistance Program to qualify for this discounted rate.  To apply, please call [employer] at [phone], select option 4, select option 2, ask to apply for  Patient Assistance Program. Theodore Demark will ask your household income, and how many people  are in your household. They will quote your out-of-pocket cost based on that information.  [employer] will also be able to set up a 54-month interest-free payment plan if needed.  Applying the monitor   Shave hair from upper left chest.  Hold abrader disc by orange tab. Rub abrader in 40  strokes over the upper left chest as  indicated in your monitor instructions.  Clean area with 4 enclosed alcohol pads. Let dry.  Apply patch as indicated in monitor instructions. Patch will be placed under collarbone on left  side of chest with arrow pointing upward.  Rub patch adhesive wings for 2 minutes. Remove white label marked "1". Remove the white  label marked "2". Rub patch adhesive wings for 2 additional minutes.  While looking in a mirror, press and release button in center of patch. A small green light will  flash 3-4 times. This will be your only indicator that the monitor has been turned on.  Do not shower for the first 24 hours. You may shower after the first 24 hours.  Press the button if you feel a symptom. You will hear a small click. Record Date, Time and  Symptom in the Patient Logbook.  When you are ready to remove the patch, follow instructions on the last 2 pages of Patient  Logbook. Stick patch monitor onto the last page of Patient Logbook.  Place Patient Logbook in the blue and white box. Use locking tab on box and tape box closed  securely. The blue and white box has prepaid postage on it. Please place it in the mailbox as  soon as possible. Your physician should have your test results approximately 7 days after the  monitor has been mailed back to [facility]  Call [place] [phone] you have questions regarding  your ZIO XT patch monitor. Call them immediately if you see an orange light blinking on your  monitor.  If your monitor falls off in less than 4 days, contact our Monitor department at [phone]  If your monitor becomes loose or falls off after 4 days call [employer] at [phone]  suggestions on securing your monitor   Important Information About Sugar

## 2022-01-30 DIAGNOSIS — R001 Bradycardia, unspecified: Secondary | ICD-10-CM | POA: Diagnosis not present

## 2022-01-31 NOTE — Progress Notes (Unsigned)
08/16/13- 51 yoM never smoker followed for OSA, allergic rhinitis complicated by GERD, Leukopenia CPAP 8/[facility]       PCP [place]: Pt stated he is sleeping alright. Wearing CPAP 8/ Advanced nightly for 7-8 hours. Denies issue with machine and pressure. Pt states the nasal pillows are begining to wear- needs replacement.   02/02/22- 86 yoM never smoker to re-establish for OSA, self referred to replace old CPAP problem list includes- HTN, Allergic Rhinitis, GERD, Leukopenia,  HST 06/28/11- AHI 60.2/ hr, desaturation to 84%, body weight 189 lbs CPAP 8/ [facility] Download compliance-100%, AHI 5.4/ hr Body weight today-187 lbs Covid vax-5 Phizer Flu vax- had -----Cardiologist referred pt, thought settings on CPAP might need changing. Pt states he wakes up a lot and doesn't get a deep sleep, feels tired all the time  -CPAP stopped his migraines years ago and he has been reluctant to try without.  His old S9 machine is still working but I suggested we replace it before it fails on him unexpectedly.  Download reviewed. Bedtime between 10 and 11 PM, needing 30 to 60 minutes to fall asleep.  Waking between 6 and 8 times during the night-often for nocturia, before up at 6:30 AM.   Discussed switch to AutoPap.  I pointed out that download shows compliant use and good enough control of his apneas that I cannot tell him adjusting his CPAP will improve his sleep quality or daytime energy.  We can look at him after CPAP is replaced with question of whether treatment for insomnia might help. Breathing is comfortable.  He is still actively working.  ROS-see HPI   += positive Constitutional:   No-   weight loss, night sweats, fevers, chills, +fatigue, lassitude. HEENT:   No-  headaches, difficulty swallowing, tooth/dental problems, sore throat,       +Little  sneezing, itching, ear ache, nasal congestion, post nasal drip,  CV:  No-   chest pain, orthopnea, PND, swelling in lower extremities,  anasarca, dizziness, palpitations Resp: No-   shortness of breath with exertion or at rest.              No-   productive cough,  No non-productive cough,  No- coughing up of blood.              No-   change in color of mucus.  No- wheezing.   Skin: No-   rash or lesions. GI:  No-   heartburn, indigestion, abdominal pain, nausea, vomiting,  GU: MS:  No-   joint pain or swelling.  . Neuro-     +occasional dizziness Psych:  No- change in mood or affect. No depression or anxiety.  No memory loss.  OBJ- Physical Exam General- Alert, Oriented, Affect-appropriate, Distress- none acute, overweight Skin- rash-none, lesions- none, excoriation- none Lymphadenopathy- none Head- atraumatic            Eyes- Gross vision intact, PERRLA, conjunctivae and secretions clear            Ears- Hearing aid            Nose- Clear, no-Septal dev, mucus, polyps, erosion, perforation             Throat- Mallampati III mucosa clear , drainage- none, tonsils- atrophic.  + Upper plate, lower partial. Neck- flexible , trachea midline, no stridor , thyroid nl, carotid no bruit Chest - symmetrical excursion , unlabored           Heart/CV- RRR ,  no murmur , no gallop  , no rub, nl s1 s2                           - JVD- none , edema- none, stasis changes- none, varices- none           Lung- clear to P&A, wheeze- none, cough- none , dullness-none, rub- none           Chest wall-  Abd- Br/ Gen/ Rectal- Not done, not indicated Extrem- cyanosis- none, clubbing, none, atrophy- none, strength- nl Neuro- grossly intact to observation. No carotid bruit, no nystagmus

## 2022-02-02 ENCOUNTER — Encounter: Payer: Self-pay | Admitting: Internal Medicine

## 2022-02-02 ENCOUNTER — Ambulatory Visit (INDEPENDENT_AMBULATORY_CARE_PROVIDER_SITE_OTHER): Payer: Medicare Other | Admitting: Internal Medicine

## 2022-02-02 VITALS — BP 126/84 | HR 83 | Ht 68.0 in | Wt 187.2 lb

## 2022-02-02 DIAGNOSIS — G4733 Obstructive sleep apnea (adult) (pediatric): Secondary | ICD-10-CM | POA: Diagnosis not present

## 2022-02-02 NOTE — Patient Instructions (Signed)
Order- DME [facility]- please replace old CPAP machine, change to auto 5-15, mask of choice, humidifier, suppliers, AirView/ card

## 2022-02-03 NOTE — Assessment & Plan Note (Addendum)
He has benefited from CPAP and it is appropriate now to replace his old machine. Waking frequently at night, at least some of which may be nocturia.  The sleep disturbance likely explains his complaint of daytime fatigue although he thinks just getting older is part of the issue as well.  We can reassess after his CPAP is replaced. Plan-we will place old S9 machine, changing to AutoPap 5-15

## 2022-02-04 ENCOUNTER — Ambulatory Visit (HOSPITAL_COMMUNITY): Payer: Medicare Other | Attending: Cardiovascular Disease

## 2022-02-04 DIAGNOSIS — R001 Bradycardia, unspecified: Secondary | ICD-10-CM | POA: Diagnosis not present

## 2022-02-04 DIAGNOSIS — R600 Localized edema: Secondary | ICD-10-CM | POA: Diagnosis not present

## 2022-02-04 DIAGNOSIS — G4733 Obstructive sleep apnea (adult) (pediatric): Secondary | ICD-10-CM | POA: Diagnosis not present

## 2022-02-04 LAB — ECHOCARDIOGRAM COMPLETE
Area-P 1/2: 3.03 cm2
S' Lateral: 3.1 cm

## 2022-02-10 DIAGNOSIS — R001 Bradycardia, unspecified: Secondary | ICD-10-CM | POA: Diagnosis not present

## 2022-04-22 IMAGING — CR DG ABDOMEN 2V
3 series · 3 of 3 positions shown · non-contrast
Comparison: None.

CLINICAL DATA: Constipation, unspecified constipation type

EXAM:
ABDOMEN - 2 VIEW

[w abdomen upright *]
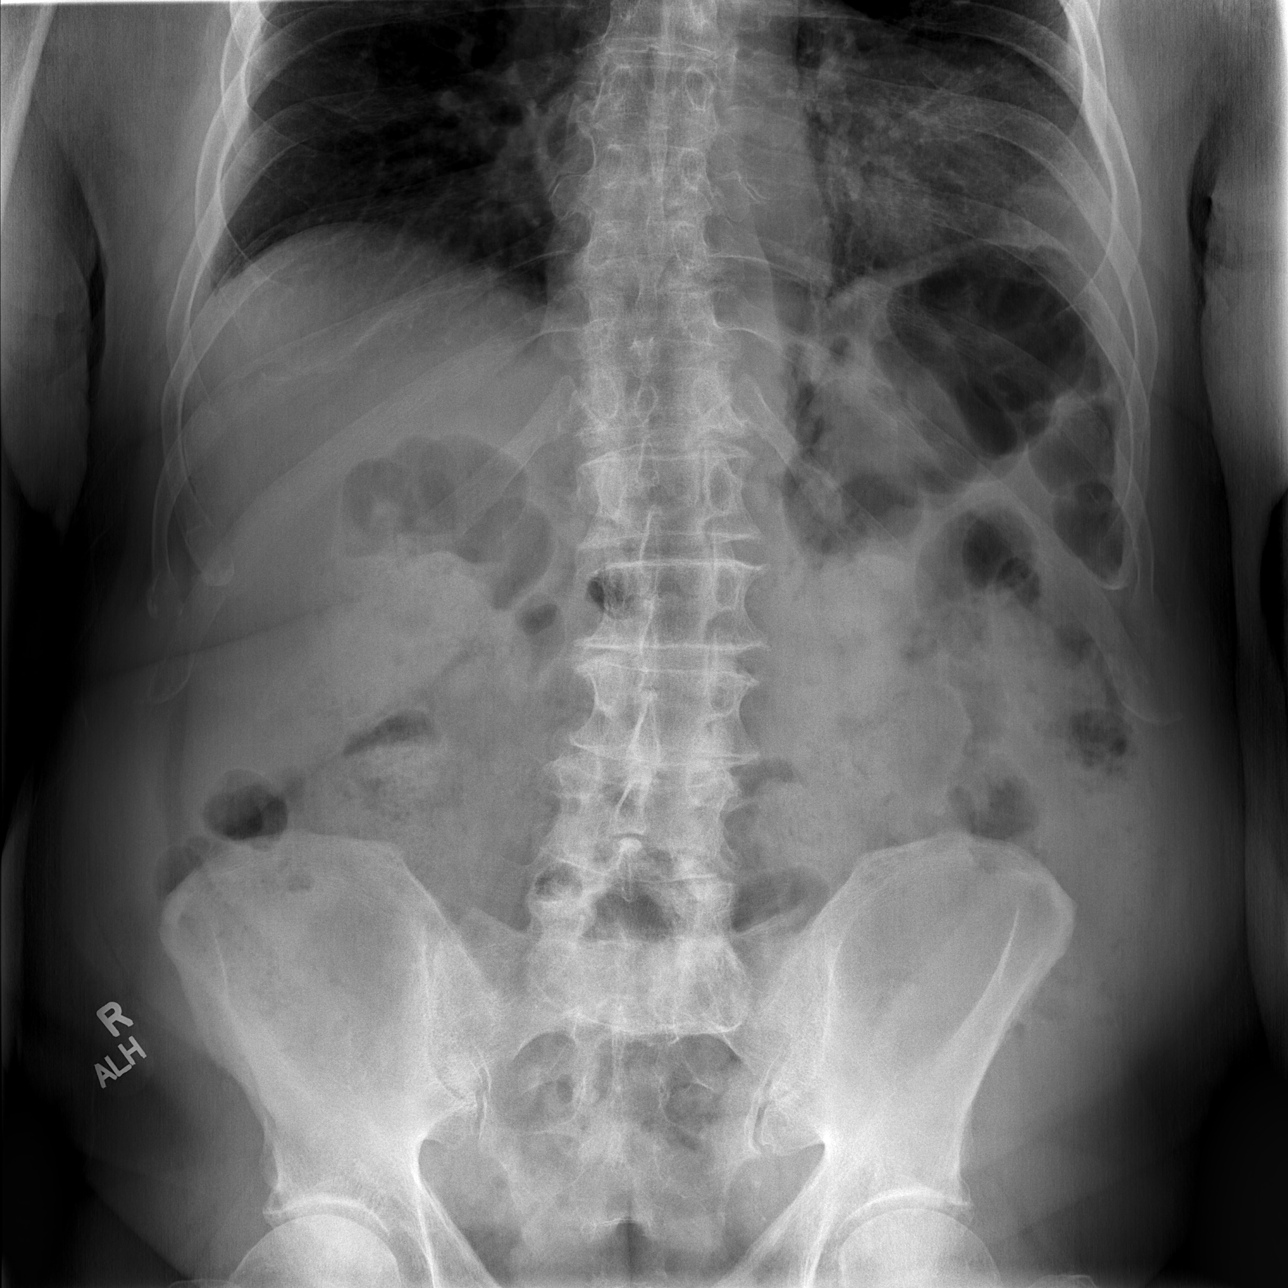

[t abdomen supine (1 of 2)]
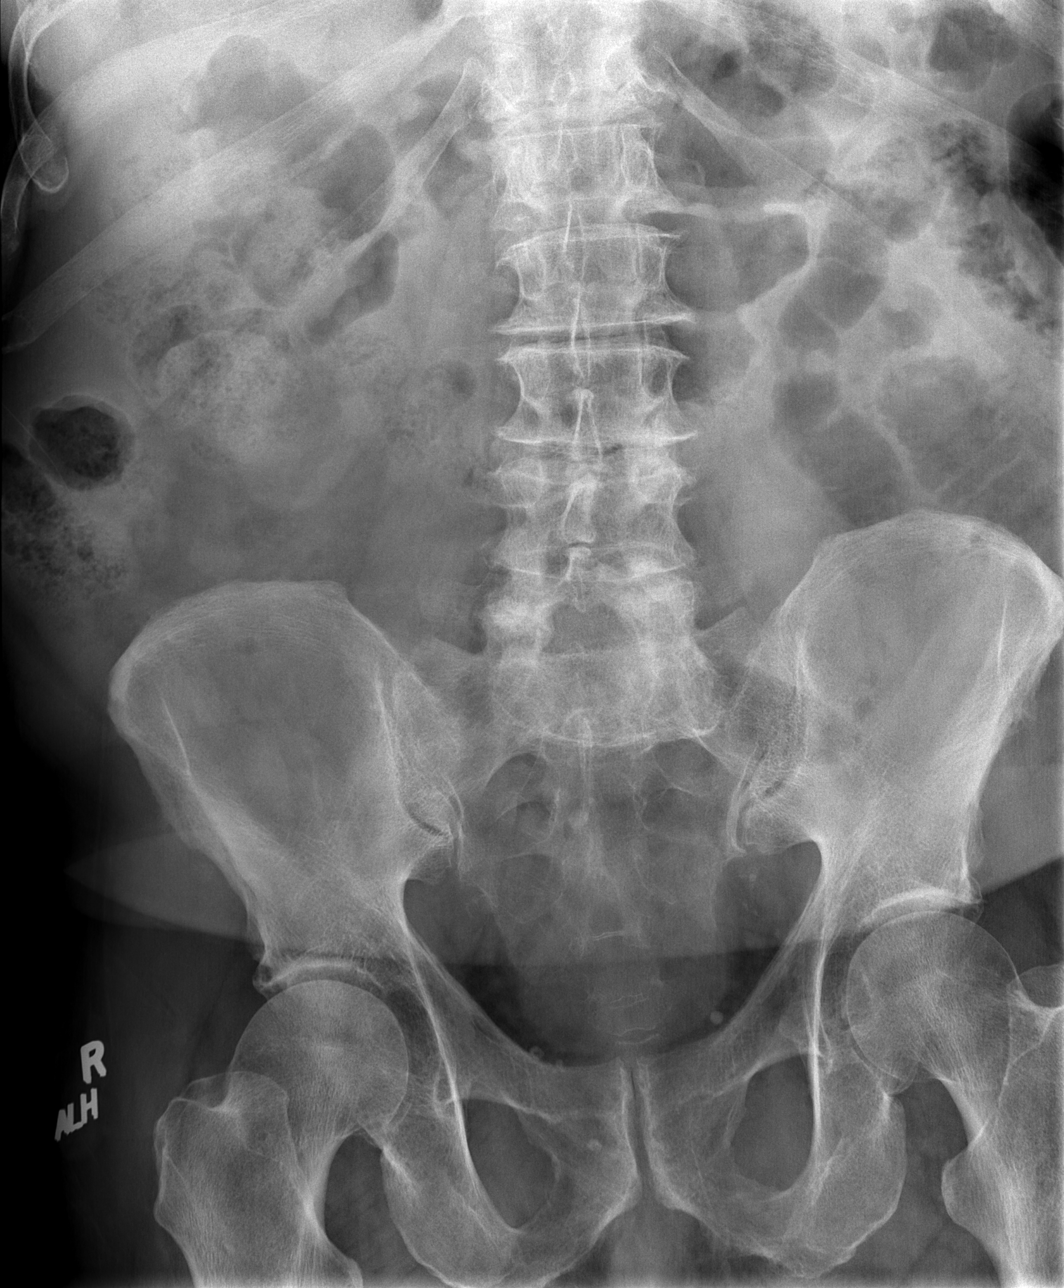

[t abdomen supine (2 of 2)]
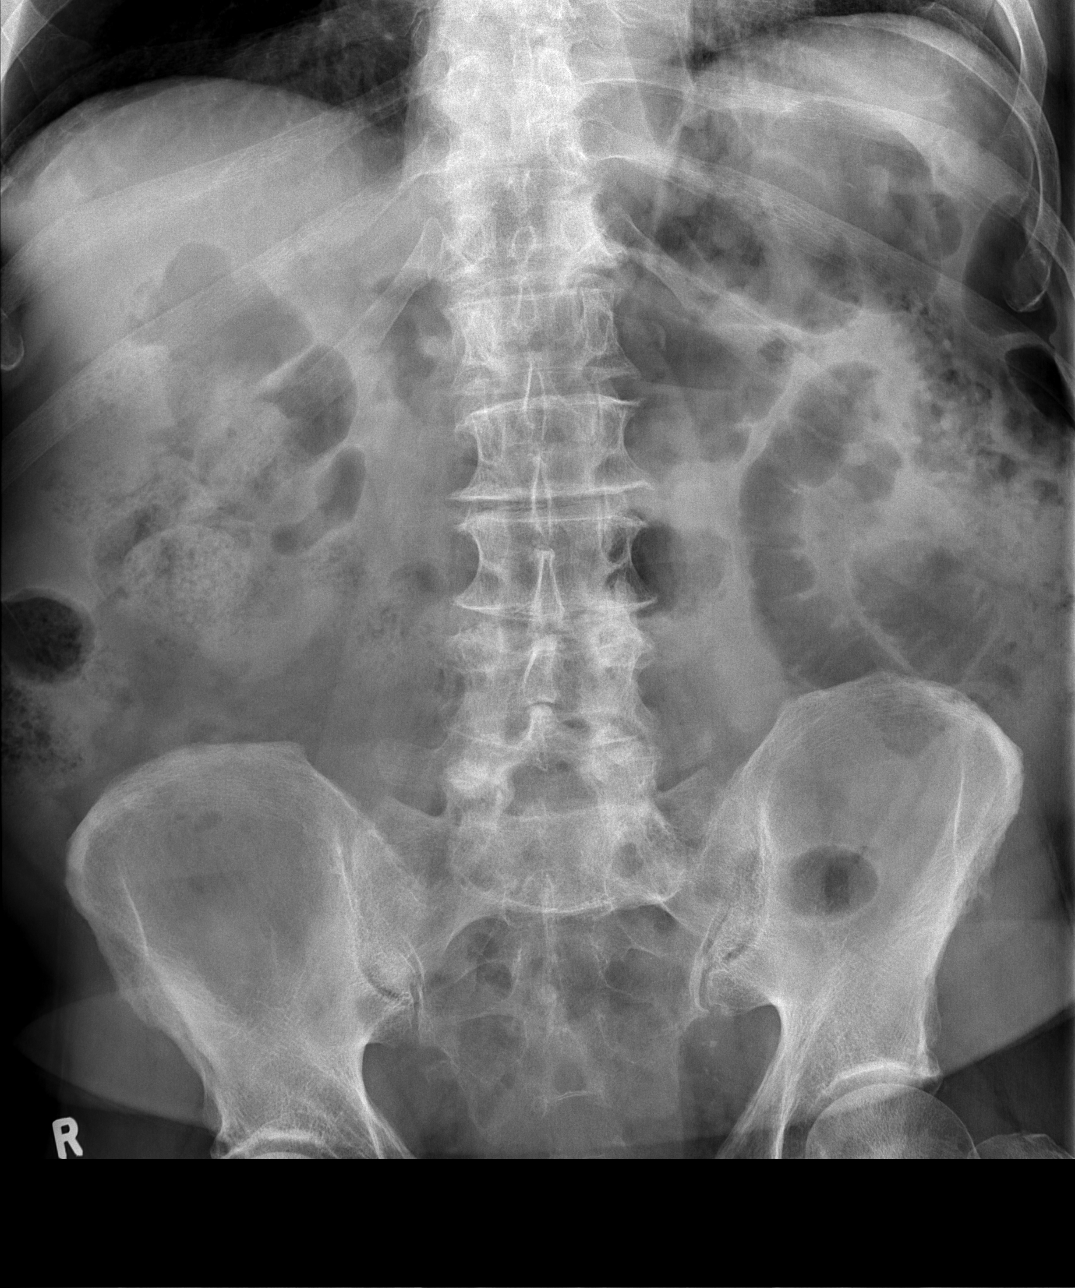

[3 of 3 positions shown; findings below may reference images not displayed]

FINDINGS: Bowel-gas pattern is nonobstructive. There is formed stool
throughout the colon. There is multilevel degenerative changes of
the spine.
IMPRESSION: Nonobstructive bowel gas pattern with moderate burden of formed
stool within the colon.

## 2022-05-10 ENCOUNTER — Encounter: Payer: Self-pay | Admitting: Cardiovascular Disease

## 2022-05-10 ENCOUNTER — Ambulatory Visit: Payer: Medicare Other | Attending: Cardiovascular Disease | Admitting: Cardiovascular Disease

## 2022-05-10 VITALS — BP 112/68 | HR 65 | Ht 67.0 in | Wt 185.2 lb

## 2022-05-10 DIAGNOSIS — R001 Bradycardia, unspecified: Secondary | ICD-10-CM | POA: Insufficient documentation

## 2022-05-10 DIAGNOSIS — G4733 Obstructive sleep apnea (adult) (pediatric): Secondary | ICD-10-CM | POA: Diagnosis not present

## 2022-05-10 DIAGNOSIS — I10 Essential (primary) hypertension: Secondary | ICD-10-CM | POA: Insufficient documentation

## 2022-05-10 DIAGNOSIS — I4719 Other supraventricular tachycardia: Secondary | ICD-10-CM | POA: Insufficient documentation

## 2022-05-10 NOTE — Progress Notes (Signed)
Cardiology Office Note    Date:  05/10/2022   ID:  Seth Gray, DOB [birth year], MRN [id number]  PCP:  Cyndi Bender, PA-C  Cardiologist:   Sanda Klein, MD   No chief complaint on file.   History of Present Illness:  Seth Gray is a 87 y.o. male with OSA, essential hypertension and a strong family history of coronary artery disease, but without any personal history of significant coronary events. He had a coronary angiogram in 2001 which was normal (performed for a false positive ECG stress test).   Due to fatigue, echo and event monitor were checked last October. The LV was normal, but there was mild RV dilation, with normal systolic function. The monitor did not show bradycardia or chronotropic incompetence. Brief PAT was noted. He was encouraged to use CPAP consistently.  He complains about being "feeble" but he still works a full-time job at 87 yo.  He is worried about his memory and the fact that he has made mistakes on a recent project.  States that his father was in better shape at age 79 then he is currently.  The patient specifically denies any chest pain at rest or with exertion, dyspnea at rest or with exertion, orthopnea, paroxysmal nocturnal dyspnea, syncope, palpitations, focal neurological deficits, intermittent claudication, lower extremity edema, unexplained weight gain, cough, hemoptysis or wheezing.   Past Medical History:  Diagnosis Date   ALLERGIC RHINITIS    GERD (gastroesophageal reflux disease)    Hypertension    Leukopenia 03/20/2012   Sleep apnea     Past Surgical History:  Procedure Laterality Date   Dupyetren's contracture     salivary gland extraction      Current Medications: Outpatient Medications Prior to Visit  Medication Sig Dispense Refill   aspirin 81 MG tablet Take 81 mg by mouth daily.     hydrochlorothiazide (HYDRODIURIL) 25 MG tablet Take 1/2 tablet by mouth daily     omeprazole (PRILOSEC) 20 MG capsule TAKE 1 CAPSULE (20 MG  TOTAL) BY MOUTH 2 (TWO) TIMES DAILY. (Patient taking differently: Take 20 mg by mouth daily.) 180 capsule 1   tadalafil (CIALIS) 20 MG tablet Take 10-20 mg by mouth daily as needed.     No facility-administered medications prior to visit.     Allergies:   Amoxicillin and Sulfonamide derivatives   Social History   Socioeconomic History   Marital status: Married    Spouse name: Not on file   Number of children: Not on file   Years of education: Not on file   Highest education level: Not on file  Occupational History   Not on file  Tobacco Use   Smoking status: Never   Smokeless tobacco: Never  Substance and Sexual Activity   Alcohol use: No   Drug use: No   Sexual activity: Not Currently  Other Topics Concern   Not on file  Social History Narrative   Not on file   Social Determinants of Health   Financial Resource Strain: Not on file  Food Insecurity: Not on file  Transportation Needs: Not on file  Physical Activity: Not on file  Stress: Not on file  Social Connections: Not on file     Family History:  The patient's family history includes Arthritis in his mother; Heart disease in his father; Heart failure in his mother.   ROS:   Please see the history of present illness.    ROS All other systems reviewed and are negative.  PHYSICAL EXAM:   VS:  BP 112/68 (BP Location: Left Arm, Patient Position: Sitting, Cuff Size: Normal)   Pulse 65   Ht '5\' 7"'$  (1.702 m)   Wt 185 lb 3.2 oz (84 kg)   SpO2 97%   BMI 29.01 kg/m      General: Alert, oriented x3, no distress, fairly obese.  Appears younger than stated age. Head: no evidence of trauma, PERRL, EOMI, no exophtalmos or lid lag, no myxedema, no xanthelasma; normal ears, nose and oropharynx Neck: normal jugular venous pulsations and no hepatojugular reflux; brisk carotid pulses without delay and no carotid bruits Chest: clear to auscultation, no signs of consolidation by percussion or palpation, normal fremitus,  symmetrical and full respiratory excursions Cardiovascular: normal position and quality of the apical impulse, regular rhythm, normal first and second heart sounds, no murmurs, rubs or gallops Abdomen: no tenderness or distention, no masses by palpation, no abnormal pulsatility or arterial bruits, normal bowel sounds, no hepatosplenomegaly Extremities: no clubbing, cyanosis or edema; 2+ radial, ulnar and brachial pulses bilaterally; 2+ right femoral, posterior tibial and dorsalis pedis pulses; 2+ left femoral, posterior tibial and dorsalis pedis pulses; no subclavian or femoral bruits Neurological: grossly nonfocal Psych: Normal mood and affect    Wt Readings from Last 3 Encounters:  05/10/22 185 lb 3.2 oz (84 kg)  02/02/22 187 lb 3.2 oz (84.9 kg)  01/28/22 189 lb (85.7 kg)      Studies/Labs Reviewed:   EKG:  EKG is ordered today.  Shows sinus bradycardia at 53 bpm but is otherwise normal tracing. Recent Labs: 09/17/2019 Total cholesterol 172, HDL 40, LDL 103, triglycerides 124  01/15/2020 Hemoglobin 14.8, creatinine 1.18 normal liver function tests, TSH 1.28  03/23/2021 TSH 2.18   09/24/2021 Total cholesterol 161, LDL 97, HDL 43, triglycerides 118 Creatinine 1.05, glucose 99, potassium 3.8   ASSESSMENT:    1. Bradycardia   2. Essential hypertension   3. OSA (obstructive sleep apnea)   4. Paroxysmal atrial tachycardia      PLAN:  In order of problems listed above:  Bradycardia: normal heart rate response to activity was noted on rhythm monitor. HTN: Well controlled.  Avoid beta-blockers and other medications with negative chronotropic effect. OSA: Mildly dilated RV on echo, but normal RV function.  PAT: no atrial fibrillation seen. Could be related to right heart strain from OSA.     Medication Adjustments/Labs and Tests Ordered: Current medicines are reviewed at length with the patient today.  Concerns regarding medicines are outlined above.  Medication changes,  Labs and Tests ordered today are listed in the Patient Instructions below. Patient Instructions  Medication Instructions:  Your physician recommends that you continue on your current medications as directed. Please refer to the Current Medication list given to you today.  *If you need a refill on your cardiac medications before your next appointment, please call your pharmacy*   Follow-Up: At [facility], you and your health needs are our priority.  As part of our continuing mission to provide you with exceptional heart care, we have created designated Provider Care Teams.  These Care Teams include your primary Cardiologist (physician) and Advanced Practice Providers (APPs -  Physician Assistants and Nurse Practitioners) who all work together to provide you with the care you need, when you need it.  We recommend signing up for the patient portal called "MyChart".  Sign up information is provided on this After Visit Summary.  MyChart is used to connect with patients for Virtual Visits (  Telemedicine).  Patients are able to view lab/test results, encounter notes, upcoming appointments, etc.  Non-urgent messages can be sent to your provider as well.   To learn more about what you can do with MyChart, go to [email or web address].    Your next appointment:   12 month(s)  The format for your next appointment:   In Person  Provider:   Sanda Klein, MD     Signed, Sanda Klein, MD  05/10/2022 8:58 AM    [address], [place], [place]  [address] Phone: [phone]; Fax: [phone]

## 2022-05-10 NOTE — Patient Instructions (Signed)
Medication Instructions:  Your physician recommends that you continue on your current medications as directed. Please refer to the Current Medication list given to you today.  *If you need a refill on your cardiac medications before your next appointment, please call your pharmacy*   Follow-Up: At [facility], you and your health needs are our priority.  As part of our continuing mission to provide you with exceptional heart care, we have created designated Provider Care Teams.  These Care Teams include your primary Cardiologist (physician) and Advanced Practice Providers (APPs -  Physician Assistants and Nurse Practitioners) who all work together to provide you with the care you need, when you need it.  We recommend signing up for the patient portal called "MyChart".  Sign up information is provided on this After Visit Summary.  MyChart is used to connect with patients for Virtual Visits (Telemedicine).  Patients are able to view lab/test results, encounter notes, upcoming appointments, etc.  Non-urgent messages can be sent to your provider as well.   To learn more about what you can do with MyChart, go to [email or web address].    Your next appointment:   12 month(s)  The format for your next appointment:   In Person  Provider:   Mihai Croitoru, MD 

## 2022-06-09 DIAGNOSIS — I10 Essential (primary) hypertension: Secondary | ICD-10-CM | POA: Diagnosis not present

## 2022-06-09 DIAGNOSIS — Z79899 Other long term (current) drug therapy: Secondary | ICD-10-CM | POA: Diagnosis not present

## 2022-06-09 DIAGNOSIS — G4733 Obstructive sleep apnea (adult) (pediatric): Secondary | ICD-10-CM | POA: Diagnosis not present

## 2022-06-09 DIAGNOSIS — Z9181 History of falling: Secondary | ICD-10-CM | POA: Diagnosis not present

## 2022-06-09 DIAGNOSIS — G319 Degenerative disease of nervous system, unspecified: Secondary | ICD-10-CM | POA: Diagnosis not present

## 2022-06-09 DIAGNOSIS — N400 Enlarged prostate without lower urinary tract symptoms: Secondary | ICD-10-CM | POA: Diagnosis not present

## 2022-06-09 DIAGNOSIS — K219 Gastro-esophageal reflux disease without esophagitis: Secondary | ICD-10-CM | POA: Diagnosis not present

## 2022-06-09 DIAGNOSIS — Z1331 Encounter for screening for depression: Secondary | ICD-10-CM | POA: Diagnosis not present

## 2022-06-09 DIAGNOSIS — D509 Iron deficiency anemia, unspecified: Secondary | ICD-10-CM | POA: Diagnosis not present

## 2022-06-09 DIAGNOSIS — Z139 Encounter for screening, unspecified: Secondary | ICD-10-CM | POA: Diagnosis not present

## 2022-06-09 DIAGNOSIS — G3184 Mild cognitive impairment, so stated: Secondary | ICD-10-CM | POA: Diagnosis not present

## 2022-06-09 DIAGNOSIS — E781 Pure hyperglyceridemia: Secondary | ICD-10-CM | POA: Diagnosis not present

## 2022-06-17 DIAGNOSIS — I10 Essential (primary) hypertension: Secondary | ICD-10-CM | POA: Diagnosis not present

## 2022-06-17 DIAGNOSIS — N289 Disorder of kidney and ureter, unspecified: Secondary | ICD-10-CM | POA: Diagnosis not present

## 2022-07-06 DIAGNOSIS — N401 Enlarged prostate with lower urinary tract symptoms: Secondary | ICD-10-CM | POA: Diagnosis not present

## 2022-07-06 DIAGNOSIS — R350 Frequency of micturition: Secondary | ICD-10-CM | POA: Diagnosis not present

## 2022-07-06 DIAGNOSIS — N3941 Urge incontinence: Secondary | ICD-10-CM | POA: Diagnosis not present

## 2022-07-28 DIAGNOSIS — K644 Residual hemorrhoidal skin tags: Secondary | ICD-10-CM | POA: Diagnosis not present

## 2022-07-28 DIAGNOSIS — R600 Localized edema: Secondary | ICD-10-CM | POA: Diagnosis not present

## 2022-08-14 DIAGNOSIS — K219 Gastro-esophageal reflux disease without esophagitis: Secondary | ICD-10-CM | POA: Diagnosis not present

## 2022-08-14 DIAGNOSIS — E860 Dehydration: Secondary | ICD-10-CM | POA: Diagnosis not present

## 2022-08-14 DIAGNOSIS — E876 Hypokalemia: Secondary | ICD-10-CM | POA: Diagnosis not present

## 2022-08-14 DIAGNOSIS — R112 Nausea with vomiting, unspecified: Secondary | ICD-10-CM | POA: Diagnosis not present

## 2022-08-14 DIAGNOSIS — Z79899 Other long term (current) drug therapy: Secondary | ICD-10-CM | POA: Diagnosis not present

## 2022-08-14 DIAGNOSIS — I10 Essential (primary) hypertension: Secondary | ICD-10-CM | POA: Diagnosis not present

## 2022-08-14 DIAGNOSIS — Z882 Allergy status to sulfonamides status: Secondary | ICD-10-CM | POA: Diagnosis not present

## 2022-08-14 DIAGNOSIS — Z1152 Encounter for screening for COVID-19: Secondary | ICD-10-CM | POA: Diagnosis not present

## 2022-08-14 DIAGNOSIS — Z7982 Long term (current) use of aspirin: Secondary | ICD-10-CM | POA: Diagnosis not present

## 2022-08-14 DIAGNOSIS — R140 Abdominal distension (gaseous): Secondary | ICD-10-CM | POA: Diagnosis not present

## 2022-08-25 DIAGNOSIS — E876 Hypokalemia: Secondary | ICD-10-CM | POA: Diagnosis not present

## 2022-08-25 DIAGNOSIS — K529 Noninfective gastroenteritis and colitis, unspecified: Secondary | ICD-10-CM | POA: Diagnosis not present

## 2022-08-25 DIAGNOSIS — D72829 Elevated white blood cell count, unspecified: Secondary | ICD-10-CM | POA: Diagnosis not present

## 2022-10-07 DIAGNOSIS — R350 Frequency of micturition: Secondary | ICD-10-CM | POA: Diagnosis not present

## 2022-10-07 DIAGNOSIS — N401 Enlarged prostate with lower urinary tract symptoms: Secondary | ICD-10-CM | POA: Diagnosis not present

## 2022-10-07 DIAGNOSIS — N5201 Erectile dysfunction due to arterial insufficiency: Secondary | ICD-10-CM | POA: Diagnosis not present

## 2022-10-18 DIAGNOSIS — L578 Other skin changes due to chronic exposure to nonionizing radiation: Secondary | ICD-10-CM | POA: Diagnosis not present

## 2022-10-18 DIAGNOSIS — L821 Other seborrheic keratosis: Secondary | ICD-10-CM | POA: Diagnosis not present

## 2022-10-18 DIAGNOSIS — L570 Actinic keratosis: Secondary | ICD-10-CM | POA: Diagnosis not present

## 2022-10-18 DIAGNOSIS — L820 Inflamed seborrheic keratosis: Secondary | ICD-10-CM | POA: Diagnosis not present

## 2022-11-10 DIAGNOSIS — H52223 Regular astigmatism, bilateral: Secondary | ICD-10-CM | POA: Diagnosis not present

## 2022-11-10 DIAGNOSIS — Z9841 Cataract extraction status, right eye: Secondary | ICD-10-CM | POA: Diagnosis not present

## 2022-11-10 DIAGNOSIS — H53143 Visual discomfort, bilateral: Secondary | ICD-10-CM | POA: Diagnosis not present

## 2022-11-10 DIAGNOSIS — H26492 Other secondary cataract, left eye: Secondary | ICD-10-CM | POA: Diagnosis not present

## 2022-11-10 DIAGNOSIS — Z961 Presence of intraocular lens: Secondary | ICD-10-CM | POA: Diagnosis not present

## 2022-11-10 DIAGNOSIS — H5203 Hypermetropia, bilateral: Secondary | ICD-10-CM | POA: Diagnosis not present

## 2022-11-10 DIAGNOSIS — H524 Presbyopia: Secondary | ICD-10-CM | POA: Diagnosis not present

## 2022-12-09 DIAGNOSIS — D509 Iron deficiency anemia, unspecified: Secondary | ICD-10-CM | POA: Diagnosis not present

## 2022-12-09 DIAGNOSIS — M1711 Unilateral primary osteoarthritis, right knee: Secondary | ICD-10-CM | POA: Diagnosis not present

## 2022-12-09 DIAGNOSIS — I10 Essential (primary) hypertension: Secondary | ICD-10-CM | POA: Diagnosis not present

## 2022-12-09 DIAGNOSIS — G319 Degenerative disease of nervous system, unspecified: Secondary | ICD-10-CM | POA: Diagnosis not present

## 2022-12-09 DIAGNOSIS — N400 Enlarged prostate without lower urinary tract symptoms: Secondary | ICD-10-CM | POA: Diagnosis not present

## 2022-12-09 DIAGNOSIS — K219 Gastro-esophageal reflux disease without esophagitis: Secondary | ICD-10-CM | POA: Diagnosis not present

## 2022-12-09 DIAGNOSIS — G4733 Obstructive sleep apnea (adult) (pediatric): Secondary | ICD-10-CM | POA: Diagnosis not present

## 2022-12-09 DIAGNOSIS — E781 Pure hyperglyceridemia: Secondary | ICD-10-CM | POA: Diagnosis not present

## 2022-12-09 DIAGNOSIS — G3184 Mild cognitive impairment, so stated: Secondary | ICD-10-CM | POA: Diagnosis not present

## 2023-01-18 DIAGNOSIS — Z23 Encounter for immunization: Secondary | ICD-10-CM | POA: Diagnosis not present

## 2023-01-23 DIAGNOSIS — Z23 Encounter for immunization: Secondary | ICD-10-CM | POA: Diagnosis not present

## 2023-02-04 ENCOUNTER — Ambulatory Visit: Payer: Medicare Other | Admitting: Internal Medicine

## 2023-03-20 NOTE — Progress Notes (Unsigned)
HPI M never smoker to re-establish for OSA, self referred to replace old CPAP problem list includes- HTN, Allergic Rhinitis, GERD, Leukopenia,  HST 06/28/11- AHI 60.2/ hr, desaturation to 84%, body weight 189 lbs ======================================== 02/02/22- 86 yoM never smoker to re-establish for OSA, self referred to replace old CPAP problem list includes- HTN, Allergic Rhinitis, GERD, Leukopenia,  HST 06/28/11- AHI 60.2/ hr, desaturation to 84%, body weight 189 lbs CPAP 8/ [facility] Download compliance-100%, AHI 5.4/ hr Body weight today-187 lbs Covid vax-5 Phizer Flu vax- had -----Cardiologist referred pt, thought settings on CPAP might need changing. Pt states he wakes up a lot and doesn't get a deep sleep, feels tired all the time  -CPAP stopped his migraines years ago and he has been reluctant to try without.  His old S9 machine is still working but I suggested we replace it before it fails on him unexpectedly.  Download reviewed. Bedtime between 10 and 11 PM, needing 30 to 60 minutes to fall asleep.  Waking between 6 and 8 times during the night-often for nocturia, before up at 6:30 AM.   Discussed switch to AutoPap.  I pointed out that download shows compliant use and good enough control of his apneas that I cannot tell him adjusting his CPAP will improve his sleep quality or daytime energy.  We can look at him after CPAP is replaced with question of whether treatment for insomnia might help. Breathing is comfortable.  He is still actively working.  03/21/23-  87 yoM never smoker followed for OSA, complicated by HTN, Allergic Rhinitis, GERD, Leukopenia,  CPAP 8/ [employer] Download compliance- Body weight today-   ROS-see HPI   += positive Constitutional:   No-   weight loss, night sweats, fevers, chills, +fatigue, lassitude. HEENT:   No-  headaches, difficulty swallowing, tooth/dental problems, sore throat,       +Little  sneezing, itching, ear ache, nasal congestion, post nasal drip,   CV:  No-   chest pain, orthopnea, PND, swelling in lower extremities, anasarca, dizziness, palpitations Resp: No-   shortness of breath with exertion or at rest.              No-   productive cough,  No non-productive cough,  No- coughing up of blood.              No-   change in color of mucus.  No- wheezing.   Skin: No-   rash or lesions. GI:  No-   heartburn, indigestion, abdominal pain, nausea, vomiting,  GU: MS:  No-   joint pain or swelling.  . Neuro-     +occasional dizziness Psych:  No- change in mood or affect. No depression or anxiety.  No memory loss.  OBJ- Physical Exam General- Alert, Oriented, Affect-appropriate, Distress- none acute, overweight Skin- rash-none, lesions- none, excoriation- none Lymphadenopathy- none Head- atraumatic            Eyes- Gross vision intact, PERRLA, conjunctivae and secretions clear            Ears- Hearing aid            Nose- Clear, no-Septal dev, mucus, polyps, erosion, perforation             Throat- Mallampati III mucosa clear , drainage- none, tonsils- atrophic.  + Upper plate, lower partial. Neck- flexible , trachea midline, no stridor , thyroid nl, carotid no bruit Chest - symmetrical excursion , unlabored  Heart/CV- RRR , no murmur , no gallop  , no rub, nl s1 s2                           - JVD- none , edema- none, stasis changes- none, varices- none           Lung- clear to P&A, wheeze- none, cough- none , dullness-none, rub- none           Chest wall-  Abd- Br/ Gen/ Rectal- Not done, not indicated Extrem- cyanosis- none, clubbing, none, atrophy- none, strength- nl Neuro- grossly intact to observation. No carotid bruit, no nystagmus

## 2023-03-21 ENCOUNTER — Ambulatory Visit: Payer: Medicare Other | Admitting: Internal Medicine

## 2023-03-21 ENCOUNTER — Encounter: Payer: Self-pay | Admitting: Internal Medicine

## 2023-03-21 VITALS — BP 131/71 | HR 69 | Ht 66.0 in | Wt 189.0 lb

## 2023-03-21 DIAGNOSIS — G4733 Obstructive sleep apnea (adult) (pediatric): Secondary | ICD-10-CM | POA: Diagnosis not present

## 2023-03-21 DIAGNOSIS — I10 Essential (primary) hypertension: Secondary | ICD-10-CM

## 2023-03-21 NOTE — Patient Instructions (Signed)
We can continue CPAP auto 5-15 ° °Please call if we can help °

## 2023-04-14 DIAGNOSIS — M47812 Spondylosis without myelopathy or radiculopathy, cervical region: Secondary | ICD-10-CM | POA: Diagnosis not present

## 2023-04-14 DIAGNOSIS — M62838 Other muscle spasm: Secondary | ICD-10-CM | POA: Diagnosis not present

## 2023-04-25 DIAGNOSIS — M898X1 Other specified disorders of bone, shoulder: Secondary | ICD-10-CM | POA: Diagnosis not present

## 2023-04-25 DIAGNOSIS — G8929 Other chronic pain: Secondary | ICD-10-CM | POA: Diagnosis not present

## 2023-04-25 DIAGNOSIS — M47812 Spondylosis without myelopathy or radiculopathy, cervical region: Secondary | ICD-10-CM | POA: Diagnosis not present

## 2023-05-05 ENCOUNTER — Encounter: Payer: Self-pay | Admitting: Internal Medicine

## 2023-05-05 NOTE — Assessment & Plan Note (Signed)
Benefits from CPAP with good compliance and control Plan- continue auto 5-15 

## 2023-05-05 NOTE — Assessment & Plan Note (Signed)
 Follow by PCP

## 2023-05-17 DIAGNOSIS — Z79899 Other long term (current) drug therapy: Secondary | ICD-10-CM | POA: Diagnosis not present

## 2023-05-17 DIAGNOSIS — R438 Other disturbances of smell and taste: Secondary | ICD-10-CM | POA: Diagnosis not present

## 2023-05-17 DIAGNOSIS — G3184 Mild cognitive impairment, so stated: Secondary | ICD-10-CM | POA: Diagnosis not present

## 2023-05-17 DIAGNOSIS — G319 Degenerative disease of nervous system, unspecified: Secondary | ICD-10-CM | POA: Diagnosis not present

## 2023-05-17 DIAGNOSIS — K219 Gastro-esophageal reflux disease without esophagitis: Secondary | ICD-10-CM | POA: Diagnosis not present

## 2023-05-20 ENCOUNTER — Encounter: Payer: Self-pay | Admitting: Cardiovascular Disease

## 2023-05-20 ENCOUNTER — Ambulatory Visit: Payer: Medicare Other | Attending: Cardiovascular Disease | Admitting: Cardiovascular Disease

## 2023-05-20 VITALS — BP 142/82 | HR 65 | Ht 66.0 in | Wt 187.8 lb

## 2023-05-20 DIAGNOSIS — I4719 Other supraventricular tachycardia: Secondary | ICD-10-CM

## 2023-05-20 DIAGNOSIS — G4733 Obstructive sleep apnea (adult) (pediatric): Secondary | ICD-10-CM

## 2023-05-20 DIAGNOSIS — I10 Essential (primary) hypertension: Secondary | ICD-10-CM | POA: Diagnosis not present

## 2023-05-20 MED ORDER — HYDROCHLOROTHIAZIDE 12.5 MG PO TABS: 12.5000 mg | ORAL_TABLET | ORAL | 3 refills | Status: DC

## 2023-05-20 MED ORDER — HYDROCHLOROTHIAZIDE 12.5 MG PO TABS: 12.5000 mg | ORAL_TABLET | ORAL | 3 refills | Status: AC

## 2023-05-20 NOTE — Progress Notes (Unsigned)
Cardiology Office Note    Date:  05/21/2023   ID:  Seth Gray, DOB [birth year], MRN [id number]  PCP:  Lonie Peak, PA-C  Cardiologist:   Thurmon Fair, MD   Chief Complaint  Patient presents with   Fatigue    History of Present Illness:  Seth Gray is a 88 y.o. male with OSA, essential hypertension and a strong family history of coronary artery disease, but without any personal history of significant coronary events. He had a coronary angiogram in 2001 which was normal (performed for a false positive ECG stress test).   Due to fatigue, echo and event monitor were checked October 2023. The LV was normal, but there was mild RV dilation, with normal systolic function. The monitor did not show bradycardia or chronotropic incompetence. Brief PAT was noted. He was encouraged to use CPAP consistently.  He continues to work every day, Paramedic projects.  He remains upset about the fact that he is not as mentally sharp as he was in the past, although his wife thinks that he has only very minor problems with his memory.  Similarly, he complains about feeling weak and febrile, but he is much more active than the average octogenarian.  The patient specifically denies any chest pain at rest or with exertion, dyspnea at rest or with exertion, orthopnea, paroxysmal nocturnal dyspnea, syncope, palpitations, focal neurological deficits, intermittent claudication, lower extremity edema, unexplained weight gain, cough, hemoptysis or wheezing.   Past Medical History:  Diagnosis Date   ALLERGIC RHINITIS    GERD (gastroesophageal reflux disease)    Hypertension    Leukopenia 03/20/2012   Sleep apnea     Past Surgical History:  Procedure Laterality Date   Dupyetren's contracture     salivary gland extraction      Current Medications: Outpatient Medications Prior to Visit  Medication Sig Dispense Refill   aspirin 81 MG tablet Take 81 mg by mouth daily.     omeprazole  (PRILOSEC) 20 MG capsule TAKE 1 CAPSULE (20 MG TOTAL) BY MOUTH 2 (TWO) TIMES DAILY. (Patient taking differently: Take 20 mg by mouth daily.) 180 capsule 1   tadalafil (CIALIS) 20 MG tablet Take 10-20 mg by mouth daily as needed.     tadalafil (CIALIS) 5 MG tablet Take 5 mg by mouth daily.     Misc Natural Products (NEURIVA) CHEW Chew 1 capsule by mouth daily.     nitroGLYCERIN (NITROSTAT) 0.4 MG SL tablet Place 0.4 mg under the tongue every 5 (five) minutes as needed for chest pain.     omeprazole (PRILOSEC OTC) 20 MG tablet Take 20 mg by mouth daily.     hydrochlorothiazide (HYDRODIURIL) 25 MG tablet Take 1/2 tablet by mouth daily (Patient not taking: Reported on 05/20/2023)     No facility-administered medications prior to visit.     Allergies:   Amoxicillin and Sulfonamide derivatives   Social History   Socioeconomic History   Marital status: Married    Spouse name: Not on file   Number of children: Not on file   Years of education: Not on file   Highest education level: Not on file  Occupational History   Not on file  Tobacco Use   Smoking status: Never   Smokeless tobacco: Never  Substance and Sexual Activity   Alcohol use: No   Drug use: No   Sexual activity: Not Currently  Other Topics Concern   Not on file  Social History Narrative   Not  on file   Social Drivers of Health   Financial Resource Strain: Not on file  Food Insecurity: Not on file  Transportation Needs: Not on file  Physical Activity: Not on file  Stress: Not on file  Social Connections: Not on file     Family History:  The patient's family history includes Arthritis in his mother; Heart disease in his father; Heart failure in his mother.   ROS:   Please see the history of present illness.    ROS All other systems reviewed and are negative.   PHYSICAL EXAM:   VS:  BP (!) 142/82 (BP Location: Left Arm, Patient Position: Sitting, Cuff Size: Normal)   Pulse 65   Ht 5\' 6"  (1.676 m)   Wt 187 lb  12.8 oz (85.2 kg)   SpO2 95%   BMI 30.31 kg/m      General: Alert, oriented x3, no distress, fairly obese.  Appears younger than stated age. Head: no evidence of trauma, PERRL, EOMI, no exophtalmos or lid lag, no myxedema, no xanthelasma; normal ears, nose and oropharynx Neck: normal jugular venous pulsations and no hepatojugular reflux; brisk carotid pulses without delay and no carotid bruits Chest: clear to auscultation, no signs of consolidation by percussion or palpation, normal fremitus, symmetrical and full respiratory excursions Cardiovascular: normal position and quality of the apical impulse, regular rhythm, normal first and second heart sounds, no murmurs, rubs or gallops Abdomen: no tenderness or distention, no masses by palpation, no abnormal pulsatility or arterial bruits, normal bowel sounds, no hepatosplenomegaly Extremities: no clubbing, cyanosis or edema; 2+ radial, ulnar and brachial pulses bilaterally; 2+ right femoral, posterior tibial and dorsalis pedis pulses; 2+ left femoral, posterior tibial and dorsalis pedis pulses; no subclavian or femoral bruits Neurological: grossly nonfocal Psych: Normal mood and affect    Wt Readings from Last 3 Encounters:  05/20/23 187 lb 12.8 oz (85.2 kg)  03/21/23 189 lb (85.7 kg)  05/10/22 185 lb 3.2 oz (84 kg)      Studies/Labs Reviewed:   EKG:    EKG Interpretation Date/Time:  Friday May 20 2023 11:21:38 EST Ventricular Rate:  65 PR Interval:  188 QRS Duration:  88 QT Interval:  422 QTC Calculation: 438 R Axis:   -24  Text Interpretation: Normal sinus rhythm Normal ECG No previous ECGs available Confirmed by Abem Shaddix ([id number]) on 05/20/2023 11:25:01 AM       Echocardiogram 02/04/2022:    1. Left ventricular ejection fraction, by estimation, is 65 to 70%. Left  ventricular ejection fraction by 3D volume is 61 %. The left ventricle has  normal function. The left ventricle has no regional wall motion   abnormalities. Left ventricular diastolic   parameters are consistent with Grade I diastolic dysfunction (impaired  relaxation). The average left ventricular global longitudinal strain is  -22.5 %. The global longitudinal strain is normal.   2. Right ventricular systolic function is normal. The right ventricular  size is mildly enlarged. Tricuspid regurgitation signal is inadequate for  assessing PA pressure.   3. Right atrial size was mildly dilated.   4. The mitral valve is grossly normal. Trivial mitral valve  regurgitation.   5. The aortic valve is tricuspid. There is mild calcification of the  aortic valve. There is mild thickening of the aortic valve. Aortic valve  regurgitation is trivial. Aortic valve sclerosis/calcification is present,  without any evidence of aortic  stenosis.   6. Aortic dilatation noted. There is borderline dilatation of the  ascending aorta,  measuring 37 mm.   7. The inferior vena cava is normal in size with greater than 50%  respiratory variability, suggesting right atrial pressure of 3 mmHg.   Comparison(s): No prior Echocardiogram.    Arrhythmia monitor 02/11/2022:  Mildly abnormal event monitor due to the occurrence of occasional brief episodes of nonsustained ectopic atrial tachycardia.  There is no evidence of chronotropic incompetence.   No atrial fibrillation is seen.  No severe bradycardia is identified.  Recent Labs: 09/17/2019 Total cholesterol 172, HDL 40, LDL 103, triglycerides 124  01/15/2020 Hemoglobin 14.8, creatinine 1.18 normal liver function tests, TSH 1.28  03/23/2021 TSH 2.18   09/24/2021 Total cholesterol 161, LDL 97, HDL 43, triglycerides 118 Creatinine 1.05, glucose 99, potassium 3.8  02/09/2023  LDL 63, triglycerides 145, hemoglobin A1c 6.3% Creatinine 1.23, potassium 4.8, TSH 2.150   ASSESSMENT:    1. Essential hypertension   2. OSA (obstructive sleep apnea)   3. PAT (paroxysmal atrial tachycardia) (HCC)       PLAN:  In order of problems listed above:  Bradycardia: Does not appear to be a clinically relevant issue; normal heart rate today and he had a normal response to activity on rhythm monitor. HTN: His hydrochlorothiazide was stopped when there was a slight worsening of his kidney function but ever since then he has noticed that his blood pressure is elevated.  His creatinine in October is actually very similar to values from 2021.  Will restart the hydrochlorothiazide lower dose, 12.5 mg every other day. OSA: Mildly dilated RV on echo, but normal RV function.  Denies daytime hypersomnolence, but I wonder how much of his lack of mental sharpness is related to sleep apnea, rather than age. PAT: Seen on one of his previous monitors (nonsustained, maximum 17 consecutive beats), but he's never had atrial fibrillation. Could be related to right heart strain from OSA.     Medication Adjustments/Labs and Tests Ordered: Current medicines are reviewed at length with the patient today.  Concerns regarding medicines are outlined above.  Medication changes, Labs and Tests ordered today are listed in the Patient Instructions below. Patient Instructions  Medication Instructions:  Hydrochlorothiazide 12.5 MG Every other day *If you need a refill on your cardiac medications before your next appointment, please call your pharmacy*  BP LOG- SEND READINGS IN ONE MONTH  Follow-Up: At [facility], you and your health needs are our priority.  As part of our continuing mission to provide you with exceptional heart care, we have created designated Provider Care Teams.  These Care Teams include your primary Cardiologist (physician) and Advanced Practice Providers (APPs -  Physician Assistants and Nurse Practitioners) who all work together to provide you with the care you need, when you need it.  We recommend signing up for the patient portal called "MyChart".  Sign up information is provided on this  After Visit Summary.  MyChart is used to connect with patients for Virtual Visits (Telemedicine).  Patients are able to view lab/test results, encounter notes, upcoming appointments, etc.  Non-urgent messages can be sent to your provider as well.   To learn more about what you can do with MyChart, go to [email or web address].    Your next appointment:   1 year(s)  Provider:   Thurmon Fair, MD              Signed, Thurmon Fair, MD  05/21/2023 7:50 PM    [facility] [address] [place], [place], [place]  [address] Phone: [phone])  [phone]; Fax: [phone]

## 2023-05-20 NOTE — Patient Instructions (Addendum)
Medication Instructions:  Hydrochlorothiazide 12.5 MG Every other day *If you need a refill on your cardiac medications before your next appointment, please call your pharmacy*  BP LOG- SEND READINGS IN ONE MONTH  Follow-Up: At [facility], you and your health needs are our priority.  As part of our continuing mission to provide you with exceptional heart care, we have created designated Provider Care Teams.  These Care Teams include your primary Cardiologist (physician) and Advanced Practice Providers (APPs -  Physician Assistants and Nurse Practitioners) who all work together to provide you with the care you need, when you need it.  We recommend signing up for the patient portal called "MyChart".  Sign up information is provided on this After Visit Summary.  MyChart is used to connect with patients for Virtual Visits (Telemedicine).  Patients are able to view lab/test results, encounter notes, upcoming appointments, etc.  Non-urgent messages can be sent to your provider as well.   To learn more about what you can do with MyChart, go to [email or web address].    Your next appointment:   1 year(s)  Provider:   Thurmon Fair, MD

## 2023-05-21 ENCOUNTER — Encounter: Payer: Self-pay | Admitting: Cardiovascular Disease

## 2023-06-13 DIAGNOSIS — N410 Acute prostatitis: Secondary | ICD-10-CM | POA: Diagnosis not present

## 2023-06-13 DIAGNOSIS — I10 Essential (primary) hypertension: Secondary | ICD-10-CM | POA: Diagnosis not present

## 2023-06-20 DIAGNOSIS — N400 Enlarged prostate without lower urinary tract symptoms: Secondary | ICD-10-CM | POA: Diagnosis not present

## 2023-06-20 DIAGNOSIS — G3184 Mild cognitive impairment, so stated: Secondary | ICD-10-CM | POA: Diagnosis not present

## 2023-06-20 DIAGNOSIS — M1711 Unilateral primary osteoarthritis, right knee: Secondary | ICD-10-CM | POA: Diagnosis not present

## 2023-06-20 DIAGNOSIS — E781 Pure hyperglyceridemia: Secondary | ICD-10-CM | POA: Diagnosis not present

## 2023-06-20 DIAGNOSIS — Z139 Encounter for screening, unspecified: Secondary | ICD-10-CM | POA: Diagnosis not present

## 2023-06-20 DIAGNOSIS — G4733 Obstructive sleep apnea (adult) (pediatric): Secondary | ICD-10-CM | POA: Diagnosis not present

## 2023-06-20 DIAGNOSIS — D509 Iron deficiency anemia, unspecified: Secondary | ICD-10-CM | POA: Diagnosis not present

## 2023-06-20 DIAGNOSIS — Z9181 History of falling: Secondary | ICD-10-CM | POA: Diagnosis not present

## 2023-06-20 DIAGNOSIS — Z1331 Encounter for screening for depression: Secondary | ICD-10-CM | POA: Diagnosis not present

## 2023-06-20 DIAGNOSIS — I10 Essential (primary) hypertension: Secondary | ICD-10-CM | POA: Diagnosis not present

## 2023-06-20 DIAGNOSIS — K219 Gastro-esophageal reflux disease without esophagitis: Secondary | ICD-10-CM | POA: Diagnosis not present

## 2023-08-17 ENCOUNTER — Other Ambulatory Visit: Payer: Self-pay | Admitting: Cardiovascular Disease

## 2023-09-05 DIAGNOSIS — Z9842 Cataract extraction status, left eye: Secondary | ICD-10-CM | POA: Diagnosis not present

## 2023-09-05 DIAGNOSIS — H53143 Visual discomfort, bilateral: Secondary | ICD-10-CM | POA: Diagnosis not present

## 2023-09-05 DIAGNOSIS — Z961 Presence of intraocular lens: Secondary | ICD-10-CM | POA: Diagnosis not present

## 2023-09-05 DIAGNOSIS — H26491 Other secondary cataract, right eye: Secondary | ICD-10-CM | POA: Diagnosis not present

## 2023-09-05 DIAGNOSIS — H43393 Other vitreous opacities, bilateral: Secondary | ICD-10-CM | POA: Diagnosis not present

## 2023-09-12 DIAGNOSIS — K921 Melena: Secondary | ICD-10-CM | POA: Diagnosis not present

## 2023-09-12 DIAGNOSIS — R109 Unspecified abdominal pain: Secondary | ICD-10-CM | POA: Diagnosis not present

## 2023-09-12 DIAGNOSIS — R438 Other disturbances of smell and taste: Secondary | ICD-10-CM | POA: Diagnosis not present

## 2023-10-17 DIAGNOSIS — L578 Other skin changes due to chronic exposure to nonionizing radiation: Secondary | ICD-10-CM | POA: Diagnosis not present

## 2023-10-17 DIAGNOSIS — L821 Other seborrheic keratosis: Secondary | ICD-10-CM | POA: Diagnosis not present

## 2023-10-17 DIAGNOSIS — D225 Melanocytic nevi of trunk: Secondary | ICD-10-CM | POA: Diagnosis not present

## 2023-10-17 DIAGNOSIS — L570 Actinic keratosis: Secondary | ICD-10-CM | POA: Diagnosis not present

## 2023-11-12 DIAGNOSIS — S61112A Laceration without foreign body of left thumb with damage to nail, initial encounter: Secondary | ICD-10-CM | POA: Diagnosis not present

## 2023-11-12 DIAGNOSIS — S6992XA Unspecified injury of left wrist, hand and finger(s), initial encounter: Secondary | ICD-10-CM | POA: Diagnosis not present

## 2023-11-12 DIAGNOSIS — K219 Gastro-esophageal reflux disease without esophagitis: Secondary | ICD-10-CM | POA: Diagnosis not present

## 2023-11-12 DIAGNOSIS — Z7982 Long term (current) use of aspirin: Secondary | ICD-10-CM | POA: Diagnosis not present

## 2023-11-12 DIAGNOSIS — Z882 Allergy status to sulfonamides status: Secondary | ICD-10-CM | POA: Diagnosis not present

## 2023-11-15 DIAGNOSIS — Z961 Presence of intraocular lens: Secondary | ICD-10-CM | POA: Diagnosis not present

## 2023-11-15 DIAGNOSIS — H26491 Other secondary cataract, right eye: Secondary | ICD-10-CM | POA: Diagnosis not present

## 2023-11-15 DIAGNOSIS — H18413 Arcus senilis, bilateral: Secondary | ICD-10-CM | POA: Diagnosis not present

## 2023-11-15 DIAGNOSIS — H353131 Nonexudative age-related macular degeneration, bilateral, early dry stage: Secondary | ICD-10-CM | POA: Diagnosis not present

## 2023-11-22 DIAGNOSIS — S62502B Fracture of unspecified phalanx of left thumb, initial encounter for open fracture: Secondary | ICD-10-CM | POA: Diagnosis not present

## 2023-11-22 DIAGNOSIS — S61012A Laceration without foreign body of left thumb without damage to nail, initial encounter: Secondary | ICD-10-CM | POA: Diagnosis not present

## 2023-11-25 DIAGNOSIS — H2511 Age-related nuclear cataract, right eye: Secondary | ICD-10-CM | POA: Diagnosis not present

## 2023-11-25 DIAGNOSIS — Z9841 Cataract extraction status, right eye: Secondary | ICD-10-CM | POA: Diagnosis not present

## 2023-11-30 DIAGNOSIS — S61012A Laceration without foreign body of left thumb without damage to nail, initial encounter: Secondary | ICD-10-CM | POA: Diagnosis not present

## 2023-11-30 DIAGNOSIS — S62502B Fracture of unspecified phalanx of left thumb, initial encounter for open fracture: Secondary | ICD-10-CM | POA: Diagnosis not present

## 2023-11-30 DIAGNOSIS — Z9181 History of falling: Secondary | ICD-10-CM | POA: Diagnosis not present

## 2023-12-22 DIAGNOSIS — D509 Iron deficiency anemia, unspecified: Secondary | ICD-10-CM | POA: Diagnosis not present

## 2023-12-22 DIAGNOSIS — N400 Enlarged prostate without lower urinary tract symptoms: Secondary | ICD-10-CM | POA: Diagnosis not present

## 2023-12-22 DIAGNOSIS — Z23 Encounter for immunization: Secondary | ICD-10-CM | POA: Diagnosis not present

## 2023-12-22 DIAGNOSIS — I10 Essential (primary) hypertension: Secondary | ICD-10-CM | POA: Diagnosis not present

## 2023-12-22 DIAGNOSIS — S62502B Fracture of unspecified phalanx of left thumb, initial encounter for open fracture: Secondary | ICD-10-CM | POA: Diagnosis not present

## 2023-12-22 DIAGNOSIS — E781 Pure hyperglyceridemia: Secondary | ICD-10-CM | POA: Diagnosis not present

## 2023-12-22 DIAGNOSIS — G4733 Obstructive sleep apnea (adult) (pediatric): Secondary | ICD-10-CM | POA: Diagnosis not present

## 2023-12-22 DIAGNOSIS — K219 Gastro-esophageal reflux disease without esophagitis: Secondary | ICD-10-CM | POA: Diagnosis not present

## 2023-12-22 DIAGNOSIS — M1711 Unilateral primary osteoarthritis, right knee: Secondary | ICD-10-CM | POA: Diagnosis not present

## 2023-12-22 DIAGNOSIS — G3184 Mild cognitive impairment, so stated: Secondary | ICD-10-CM | POA: Diagnosis not present

## 2023-12-22 DIAGNOSIS — Z79899 Other long term (current) drug therapy: Secondary | ICD-10-CM | POA: Diagnosis not present

## 2024-01-12 DIAGNOSIS — N401 Enlarged prostate with lower urinary tract symptoms: Secondary | ICD-10-CM | POA: Diagnosis not present

## 2024-01-12 DIAGNOSIS — R350 Frequency of micturition: Secondary | ICD-10-CM | POA: Diagnosis not present

## 2024-02-08 DIAGNOSIS — Z23 Encounter for immunization: Secondary | ICD-10-CM | POA: Diagnosis not present

## 2024-02-25 DIAGNOSIS — Z23 Encounter for immunization: Secondary | ICD-10-CM | POA: Diagnosis not present

## 2024-03-19 NOTE — Progress Notes (Signed)
 HPI M never smoker to re-establish for OSA, self referred to replace old CPAP problem list includes- HTN, Allergic Rhinitis, GERD, Leukopenia,  HST 06/28/11- AHI 60.2/ hr, desaturation to 84%, body weight 189 lbs ========================================  03/20/24- 88 yoM never smoker followed for OSA, complicated by HTN, Allergic Rhinitis, GERD, Leukopenia,  CPAP 5-15/ [employer] Download compliance-100%, AHI 4.5/hr Body weight today-187 lbs Doing well Benefits from CPAP- better sleep Allergic Rhinitis/ drainage controlled, not bothering sleep. Had flu vax.  ROS-see HPI   += positive Constitutional:   No-   weight loss, night sweats, fevers, chills, +fatigue, lassitude. HEENT:   No-  headaches, difficulty swallowing, tooth/dental problems, sore throat,       +Little  sneezing, itching, ear ache, nasal congestion, post nasal drip,  CV:  No-   chest pain, orthopnea, PND, swelling in lower extremities, anasarca, dizziness, palpitations Resp: No-   shortness of breath with exertion or at rest.              No-   productive cough,  No non-productive cough,  No- coughing up of blood.              No-   change in color of mucus.  No- wheezing.   Skin: No-   rash or lesions. GI:  No-   heartburn, indigestion, abdominal pain, nausea, vomiting,  GU: MS:  No-   joint pain or swelling.  . Neuro-     +occasional dizziness Psych:  No- change in mood or affect. No depression or anxiety.  No memory loss.  OBJ- Physical Exam General- Alert, Oriented, Affect-appropriate, Distress- none acute, overweight Skin- rash-none, lesions- none, excoriation- none Lymphadenopathy- none Head- atraumatic            Eyes- Gross vision intact, PERRLA, conjunctivae and secretions clear            Ears- Hearing aid            Nose- Clear, no-Septal dev, mucus, polyps, erosion, perforation             Throat- Mallampati III mucosa clear , drainage- none, tonsils- atrophic.  + Upper plate, lower partial. Neck- flexible  , trachea midline, no stridor , thyroid  nl, carotid no bruit Chest - symmetrical excursion , unlabored           Heart/CV- RRR , no murmur , no gallop  , no rub, nl s1 s2                           - JVD- none , edema- none, stasis changes- none, varices- none           Lung- clear to P&A, wheeze- none, cough- none , dullness-none, rub- none           Chest wall-  Abd- Br/ Gen/ Rectal- Not done, not indicated Extrem- cyanosis- none, clubbing, none, atrophy- none, strength- nl Neuro- grossly intact to observation. No carotid bruit, no nystagmus

## 2024-03-20 ENCOUNTER — Ambulatory Visit (INDEPENDENT_AMBULATORY_CARE_PROVIDER_SITE_OTHER): Payer: Medicare Other | Admitting: Internal Medicine

## 2024-03-20 ENCOUNTER — Encounter: Payer: Self-pay | Admitting: Internal Medicine

## 2024-03-20 VITALS — BP 132/82 | HR 68 | Temp 97.5°F | Ht 66.0 in | Wt 187.2 lb

## 2024-03-20 DIAGNOSIS — G4733 Obstructive sleep apnea (adult) (pediatric): Secondary | ICD-10-CM

## 2024-03-20 DIAGNOSIS — J310 Chronic rhinitis: Secondary | ICD-10-CM | POA: Diagnosis not present

## 2024-03-20 NOTE — Patient Instructions (Signed)
 Glad you are doing well with CPAP- we can continue auto 5-15, mask of choice, humidifier, supplies, airView/ card  As at check out to be brought back with a sleep doctor Please call if we can help

## 2024-03-23 NOTE — Assessment & Plan Note (Signed)
 Comfortable control now

## 2024-03-23 NOTE — Assessment & Plan Note (Signed)
Benefits from CPAP with good compliance and control Plan- continue auto 5-15 

## 2024-04-05 DIAGNOSIS — M5116 Intervertebral disc disorders with radiculopathy, lumbar region: Secondary | ICD-10-CM | POA: Diagnosis not present

## 2024-04-05 DIAGNOSIS — M7062 Trochanteric bursitis, left hip: Secondary | ICD-10-CM | POA: Diagnosis not present
